# Patient Record
Sex: Male | Born: 1980 | Race: Black or African American | Hispanic: No | Marital: Single | Smoking: Never smoker
Health system: Southern US, Community
[De-identification: ages and names within clinical notes are randomized; demographics above are authoritative.]

## PROBLEM LIST (undated history)

## (undated) DIAGNOSIS — I10 Essential (primary) hypertension: Secondary | ICD-10-CM

## (undated) DIAGNOSIS — M109 Gout, unspecified: Secondary | ICD-10-CM

## (undated) HISTORY — PX: HERNIA REPAIR: SHX51

---
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
## 2001-04-10 ENCOUNTER — Encounter: Payer: Self-pay | Admitting: Emergency Medicine

## 2001-04-10 ENCOUNTER — Emergency Department (HOSPITAL_COMMUNITY): Admission: EM | Admit: 2001-04-10 | Discharge: 2001-04-10 | Payer: Self-pay | Admitting: Emergency Medicine

## 2001-07-11 ENCOUNTER — Encounter: Payer: Self-pay | Admitting: Emergency Medicine

## 2001-07-11 ENCOUNTER — Inpatient Hospital Stay (HOSPITAL_COMMUNITY): Admission: EM | Admit: 2001-07-11 | Discharge: 2001-07-12 | Payer: Self-pay | Admitting: Emergency Medicine

## 2001-07-12 ENCOUNTER — Encounter (HOSPITAL_BASED_OUTPATIENT_CLINIC_OR_DEPARTMENT_OTHER): Payer: Self-pay | Admitting: Internal Medicine

## 2003-10-27 ENCOUNTER — Emergency Department (HOSPITAL_COMMUNITY): Admission: EM | Admit: 2003-10-27 | Discharge: 2003-10-27 | Payer: Self-pay | Admitting: Emergency Medicine

## 2005-12-01 ENCOUNTER — Emergency Department (HOSPITAL_COMMUNITY): Admission: EM | Admit: 2005-12-01 | Discharge: 2005-12-01 | Payer: Self-pay | Admitting: Emergency Medicine

## 2006-11-22 ENCOUNTER — Emergency Department (HOSPITAL_COMMUNITY): Admission: EM | Admit: 2006-11-22 | Discharge: 2006-11-22 | Payer: Self-pay | Admitting: Family Medicine

## 2007-02-15 ENCOUNTER — Emergency Department (HOSPITAL_COMMUNITY): Admission: EM | Admit: 2007-02-15 | Discharge: 2007-02-15 | Payer: Self-pay | Admitting: Emergency Medicine

## 2007-07-20 ENCOUNTER — Ambulatory Visit: Payer: Self-pay | Admitting: Family Medicine

## 2007-09-21 ENCOUNTER — Ambulatory Visit (HOSPITAL_BASED_OUTPATIENT_CLINIC_OR_DEPARTMENT_OTHER): Admission: RE | Admit: 2007-09-21 | Discharge: 2007-09-21 | Payer: Self-pay | Admitting: General Surgery

## 2007-10-19 ENCOUNTER — Ambulatory Visit: Payer: Self-pay | Admitting: Family Medicine

## 2009-06-08 ENCOUNTER — Emergency Department (HOSPITAL_COMMUNITY): Admission: EM | Admit: 2009-06-08 | Discharge: 2009-06-08 | Payer: Self-pay | Admitting: Emergency Medicine

## 2009-11-27 ENCOUNTER — Emergency Department (HOSPITAL_COMMUNITY): Admission: EM | Admit: 2009-11-27 | Discharge: 2009-11-27 | Payer: Self-pay | Admitting: Emergency Medicine

## 2010-05-16 ENCOUNTER — Emergency Department (HOSPITAL_COMMUNITY): Admission: EM | Admit: 2010-05-16 | Discharge: 2010-05-16 | Payer: Self-pay | Admitting: Internal Medicine

## 2010-05-22 ENCOUNTER — Emergency Department (HOSPITAL_COMMUNITY): Admission: EM | Admit: 2010-05-22 | Discharge: 2010-05-22 | Payer: Self-pay | Admitting: Family Medicine

## 2010-09-10 ENCOUNTER — Emergency Department (HOSPITAL_COMMUNITY): Admission: EM | Admit: 2010-09-10 | Discharge: 2010-09-10 | Payer: Self-pay | Admitting: Emergency Medicine

## 2010-12-30 HISTORY — PX: OTHER SURGICAL HISTORY: SHX169

## 2011-04-08 LAB — GC/CHLAMYDIA PROBE AMP, GENITAL
Chlamydia, DNA Probe: NEGATIVE
GC Probe Amp, Genital: NEGATIVE

## 2011-05-14 NOTE — Op Note (Signed)
Jaime Barton, Jaime Barton NO.:  [id number]   MEDICAL RECORD NO.:  [id number]          PATIENT TYPE:  AMB   LOCATION:  DSC                          FACILITY:  [facility]   PHYSICIAN:  Gabrielle Dare. Janee Morn, M.D.DATE OF BIRTH:  12/24/1981   DATE OF PROCEDURE:  09/21/2007  DATE OF DISCHARGE:                               OPERATIVE REPORT   PREOPERATIVE DIAGNOSIS:  Right inguinal hernia.   POSTOPERATIVE DIAGNOSIS:  Right inguinal hernia.   PROCEDURE:  Repair of right inguinal hernia with mesh.   SURGEON:  Gabrielle Dare. Janee Morn, M.D.   ANESTHESIA:  General.   HISTORY OF PRESENT ILLNESS:  Jaime Barton is a 30 year old African  American gentleman who I evaluated in the office for a symptomatic right  inguinal hernia.  He presents today for elective repair.   PROCEDURE IN DETAIL:  Informed consent was obtained.  The patient  received intravenous antibiotics.  His site was marked after identifying  him in the preop holding area.  He was brought to the operating room.  General anesthesia was administered with laryngeal mask airway by the  anesthesia staff.  His abdomen and both groins were prepped and draped  in sterile fashion.  Marcaine 0.25% with epinephrine was injected along  the planned inguinal incision site on the right and out towards the  anterior-superior iliac spine for postoperative pain relief.   A right inguinal incision was made.  The subcutaneous tissues were  dissected down through Scarpa's fascia, revealing the external oblique  fascia.  Hemostasis was assured with Bovie cautery.  The external  oblique fascia was divided laterally.  The division was continued  sharply down through the external ring.  The superior leaflet was  dissected free off the underlying transversalis.  The inferior leaflet  was dissected down, revealing the shelving edge of the inguinal  ligament.  The inguinal branch of the ilioinguinal nerve was identified  and was a good size.  It was  carefully dissected and kept out of the way  along with the inferior leaflet of the external oblique fascia.  The  cord structures were encircled with a Penrose drain.  The floor was  inspected and was intact.  The cord was then dissected.  There was a  large indirect hernia sac that was quite stuck to the cord structures.  Great care was taken with a slow dissection.  The sac was freed up from  the cord structures.  The sac was opened.  All contents were reduced.  It seemed to only contain a small piece of omentum.  The sac was then  high ligated with a 2-0 Vicryl suture and excised, cauterizing the  edges.  The hernia repair was then completed with a keyhole  polypropylene mesh.  This was fashioned to shape and then secured with 0  Prolene, first to the tissues over the pubic tubercle medially and then  extending along inferiorly with running 0 Prolene suture securing the  mesh to the shelving edge of inguinal ligament.  Superiorly, the mesh  was again tacked to the tissues over the pubic tubercle medially  and  with 0 Prolene suture, and then it was tacked down with several  interrupted 0 Prolene sutures extending along laterally, tacking the  mesh down to the transversalis.  The two leaflets of the mesh were  rejoined behind the cord structures and tacked down together into the  underlying musculature, and they did extend out laterally underneath the  flap of the external oblique fascia.  The aperture in the mesh were then  adjusted slightly larger to admit the tip of the fifth digit.  The area  was copiously irrigated.  Meticulous hemostasis was assured.  Some  additional local anesthetic was injected.  The nerve was then carefully  repositioned beneath the external oblique fascia.  The external oblique  was closed with a running 2-0 Vicryl suture, taking care not to involve  the nerve or underlying cord structures.  The wound was then irrigated  again, and hemostasis was assured.   Scarpa's fascia was approximated  with interrupted 3-0 Vicryl sutures, and the skin was closed with a  running 4-0 Monocryl subcuticular stitch.  Sponge, needle and instrument  counts were correct.  Benzoin and Steri-Strips and sterile dressings  were applied.  The patient's right testicle was relocated to anatomic  position in the scrotum at the end of the case.   The patient tolerated the procedure well without apparent complication  and was taken to the recovery room in stable condition.      Gabrielle Dare Janee Morn, M.D.  Electronically Signed     BET/MEDQ  D:  09/21/2007  T:  09/21/2007  Job:  [id number]   cc:   Sharlot Gowda, M.D.
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
## 2011-06-29 ENCOUNTER — Emergency Department (HOSPITAL_COMMUNITY): Payer: Self-pay

## 2011-06-29 ENCOUNTER — Emergency Department (HOSPITAL_COMMUNITY)
Admission: EM | Admit: 2011-06-29 | Discharge: 2011-06-29 | Disposition: A | Discharge status: Home / Self Care | Payer: Self-pay | Attending: Emergency Medicine | Admitting: Emergency Medicine

## 2011-06-29 DIAGNOSIS — S52309A Unspecified fracture of shaft of unspecified radius, initial encounter for closed fracture: Secondary | ICD-10-CM | POA: Insufficient documentation

## 2011-06-29 DIAGNOSIS — W010XXA Fall on same level from slipping, tripping and stumbling without subsequent striking against object, initial encounter: Secondary | ICD-10-CM | POA: Insufficient documentation

## 2011-06-29 DIAGNOSIS — Y9302 Activity, running: Secondary | ICD-10-CM | POA: Insufficient documentation

## 2011-06-29 DIAGNOSIS — M79609 Pain in unspecified limb: Secondary | ICD-10-CM | POA: Insufficient documentation

## 2011-06-29 DIAGNOSIS — S6990XA Unspecified injury of unspecified wrist, hand and finger(s), initial encounter: Secondary | ICD-10-CM | POA: Insufficient documentation

## 2011-06-29 DIAGNOSIS — R000 Tachycardia, unspecified: Secondary | ICD-10-CM | POA: Insufficient documentation

## 2011-06-29 DIAGNOSIS — S59909A Unspecified injury of unspecified elbow, initial encounter: Secondary | ICD-10-CM | POA: Insufficient documentation

## 2011-07-08 ENCOUNTER — Encounter (HOSPITAL_COMMUNITY)
Admission: RE | Admit: 2011-07-08 | Discharge: 2011-07-08 | Disposition: A | Discharge status: Home / Self Care | Payer: Self-pay | Source: Ambulatory Visit | Attending: Orthopedic Surgery | Admitting: Orthopedic Surgery

## 2011-07-08 DIAGNOSIS — Z01812 Encounter for preprocedural laboratory examination: Secondary | ICD-10-CM | POA: Insufficient documentation

## 2011-07-08 DIAGNOSIS — Z01818 Encounter for other preprocedural examination: Secondary | ICD-10-CM | POA: Insufficient documentation

## 2011-07-08 LAB — CBC
HCT: 45 % (ref 39.0–52.0)
Hemoglobin: 16 g/dL (ref 13.0–17.0)
MCH: 30.1 pg (ref 26.0–34.0)
MCHC: 35.6 g/dL (ref 30.0–36.0)
RBC: 5.31 MIL/uL (ref 4.22–5.81)

## 2011-07-08 LAB — SURGICAL PCR SCREEN: Staphylococcus aureus: NEGATIVE

## 2011-07-10 ENCOUNTER — Ambulatory Visit (HOSPITAL_COMMUNITY)
Admission: RE | Admit: 2011-07-10 | Discharge: 2011-07-11 | Disposition: A | Discharge status: Home / Self Care | Payer: Self-pay | Source: Ambulatory Visit | Attending: Orthopedic Surgery | Admitting: Orthopedic Surgery

## 2011-07-10 DIAGNOSIS — S52309A Unspecified fracture of shaft of unspecified radius, initial encounter for closed fracture: Secondary | ICD-10-CM | POA: Insufficient documentation

## 2011-07-10 DIAGNOSIS — W19XXXA Unspecified fall, initial encounter: Secondary | ICD-10-CM | POA: Insufficient documentation

## 2011-07-22 NOTE — Op Note (Signed)
NAMENEHAN, FLAUM NO.:  [id number]  MEDICAL RECORD NO.:  [id number]  LOCATION:  5024                         FACILITY:  [facility]  PHYSICIAN:  Madelynn Done, MD  DATE OF BIRTH:  08-27-1981  DATE OF PROCEDURE:  07/10/2011 DATE OF DISCHARGE:                              OPERATIVE REPORT   PREOPERATIVE DIAGNOSIS:  Right radial shaft fracture, Galeazzi fracture variant.  POSTOPERATIVE DIAGNOSIS:  Right radial shaft fracture, Galeazzi fracture variant.  ATTENDING PHYSICIAN:  Sharma Covert IV, MD, who scrubbed and present for the entire procedure.  ASSISTANT SURGEON:  None.  SURGICAL PROCEDURES:  Open treatment of right radial shaft fracture, Galeazzi fracture variant with internal fixation of the radial shaft without fixation of distal radioulnar joint.  RADIOGRAPHS:  Three views right forearm.  SURGICAL IMPLANTS:  DePuy, small fragment, DCP plate with 6 cortices proximally, 6 cortices distally, compression plating technique.  SURGICAL INDICATIONS:  Mr. Stary is a 30 year old right hand dominant gentleman who sustained a closed injury to his right radial shaft.  Thus recommended the patient undergo the above procedure.  Risks, benefits, and alternatives were discussed in detail with the patient and signed informed consent was obtained.  Risks include but not limited to bleeding, infection, damage to nearby nerves, arteries, or tendons, nonunion, malunion, malrotation, hardware failure, loss of motion in the wrist and digits and need for further surgical intervention.  DESCRIPTION OF PROCEDURE:  The patient was brought and identified in the preop holding area, and a mark with permanent marker made on the right radial shaft preformed to indicate correct operative site.  The patient was brought back to the operating room, placed supine on anesthesia room table.  General anesthesia was administered.  The patient tolerated this well.  A well-padded  tourniquet was then placed in the right forearm and sealed with 1000 drape.  The right upper extremity was prepped and draped in a normal sterile fashion.  Time-out was called.  The correct side was identified and procedure was then begun.  The patient then turned to the right forearm for a longitudinal incision made directly over the FCR sheath.  Dissection was then carried down through the skin and subcutaneous tissue going between the FCR and the radial artery. Dissection was then carried down to the fracture site.  The FPL was swept off, the radial shaft elevated.  The fracture site was then exposed.  An open reduction was then performed of the radial shaft. Following this, a 7-hole plate was then clamped to the bone, reduction was then maintained.  Using oblong hole distally, this was loaded in a neutral position.  Compression plating was then carried out using compression technique proximally.  __________ with good compression across the fracture site.  After both sides were then fixed, the remaining screw holes were then filled to complete 6 cortices proximally and distally with a 3.5nonlocking screws.  The appropriate 3.5 mm drill bit and depth gauge measurement.  Copious wound irrigation was then done throughout.  Final radiographs were obtained, 3-views of the forearm. The distal radioulnar joint was then assessed.  No instability after fixation of the radial shaft.  Wounds were then  irrigated.  The subcutaneous tissue was then closed with 4-0 Vicryl.  Tourniquet deflated less than 75 minutes.  The skin was then closed using a running 4-0 Prolene suture.  Adaptic dressing and sterile compressive bandage were then applied.  The patient was then placed in well-padded sugar- tong splint, extubated, and taken to recovery room in good condition.  Intraoperative radiographs, 3-views of the forearm did show the internal fixation placed in good position in both planes.  POSTOPERATIVE  PLAN:  The patient admitted for overnight for IV antibiotics and pain control, discharged in the morning, seen back in the office in approximately 2 weeks for wound check, suture removal, x- rays, and then likely transition into a forearm fracture brace and gradually use and activity.     Madelynn Done, MD     FWO/MEDQ  D:  07/10/2011  T:  07/11/2011  Job:  [id number]  Electronically Signed by Bradly Bienenstock IV MD on 07/22/2011 09:57:42 PM

## 2011-10-10 LAB — POCT HEMOGLOBIN-HEMACUE: Hemoglobin: 17

## 2012-11-14 ENCOUNTER — Emergency Department (INDEPENDENT_AMBULATORY_CARE_PROVIDER_SITE_OTHER)
Admission: EM | Admit: 2012-11-14 | Discharge: 2012-11-14 | Disposition: A | Discharge status: Home / Self Care | Payer: Self-pay | Source: Home / Self Care

## 2012-11-14 ENCOUNTER — Encounter (HOSPITAL_COMMUNITY): Payer: Self-pay | Admitting: Emergency Medicine

## 2012-11-14 DIAGNOSIS — M549 Dorsalgia, unspecified: Secondary | ICD-10-CM

## 2012-11-14 DIAGNOSIS — M791 Myalgia, unspecified site: Secondary | ICD-10-CM

## 2012-11-14 DIAGNOSIS — IMO0001 Reserved for inherently not codable concepts without codable children: Secondary | ICD-10-CM

## 2012-11-14 DIAGNOSIS — M542 Cervicalgia: Secondary | ICD-10-CM

## 2012-11-14 MED ORDER — NAPROXEN 500 MG PO TBEC: 500.0000 mg | DELAYED_RELEASE_TABLET | Freq: Two times a day (BID) | ORAL | Status: DC
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
## 2012-11-14 NOTE — ED Notes (Signed)
Requested patient place on gown for evaluation by physician.  Patient seemed reluctant, explained purpose.

## 2012-11-14 NOTE — ED Provider Notes (Signed)
Medical screening examination/treatment/procedure(s) were performed by non-physician practitioner and as supervising physician I was immediately available for consultation/collaboration.  Leslee Home, M.D.   Reuben Likes, MD 11/14/12 [phone]

## 2013-03-30 ENCOUNTER — Encounter (HOSPITAL_COMMUNITY): Payer: Self-pay

## 2013-03-30 ENCOUNTER — Emergency Department (INDEPENDENT_AMBULATORY_CARE_PROVIDER_SITE_OTHER)
Admission: EM | Admit: 2013-03-30 | Discharge: 2013-03-30 | Disposition: A | Discharge status: Home / Self Care | Payer: Self-pay | Source: Home / Self Care | Attending: Family Medicine | Admitting: Family Medicine

## 2013-03-30 DIAGNOSIS — M549 Dorsalgia, unspecified: Secondary | ICD-10-CM

## 2013-03-30 DIAGNOSIS — M542 Cervicalgia: Secondary | ICD-10-CM

## 2013-03-30 MED ORDER — CYCLOBENZAPRINE HCL 10 MG PO TABS: 10.0000 mg | ORAL_TABLET | Freq: Two times a day (BID) | ORAL | Status: DC | PRN

## 2013-03-30 MED ORDER — IBUPROFEN 800 MG PO TABS: 800.0000 mg | ORAL_TABLET | Freq: Three times a day (TID) | ORAL | Status: DC
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
## 2013-04-01 NOTE — ED Provider Notes (Signed)
Medical screening examination/treatment/procedure(s) were performed by resident physician or non-physician practitioner and as supervising physician I was immediately available for consultation/collaboration.   Kamaury Cutbirth DOUGLAS MD.   Ashaunti Treptow D Tomie Spizzirri, MD 04/01/13 1942 

## 2014-01-11 ENCOUNTER — Encounter (HOSPITAL_COMMUNITY): Payer: Self-pay | Admitting: Emergency Medicine

## 2014-01-11 ENCOUNTER — Emergency Department (INDEPENDENT_AMBULATORY_CARE_PROVIDER_SITE_OTHER)
Admission: EM | Admit: 2014-01-11 | Discharge: 2014-01-11 | Disposition: A | Discharge status: Home / Self Care | Payer: Self-pay | Source: Home / Self Care

## 2014-01-11 DIAGNOSIS — S161XXA Strain of muscle, fascia and tendon at neck level, initial encounter: Secondary | ICD-10-CM

## 2014-01-11 DIAGNOSIS — Z043 Encounter for examination and observation following other accident: Secondary | ICD-10-CM

## 2014-01-11 DIAGNOSIS — S139XXA Sprain of joints and ligaments of unspecified parts of neck, initial encounter: Secondary | ICD-10-CM

## 2014-01-11 DIAGNOSIS — S335XXA Sprain of ligaments of lumbar spine, initial encounter: Secondary | ICD-10-CM

## 2014-01-11 DIAGNOSIS — M549 Dorsalgia, unspecified: Secondary | ICD-10-CM

## 2014-01-11 DIAGNOSIS — S39012A Strain of muscle, fascia and tendon of lower back, initial encounter: Secondary | ICD-10-CM

## 2014-01-11 DIAGNOSIS — Z041 Encounter for examination and observation following transport accident: Secondary | ICD-10-CM

## 2014-01-11 MED ORDER — TRAMADOL HCL 50 MG PO TABS: 50.0000 mg | ORAL_TABLET | Freq: Four times a day (QID) | ORAL | Status: DC | PRN

## 2014-01-11 MED ORDER — DICLOFENAC POTASSIUM 50 MG PO TABS: 50.0000 mg | ORAL_TABLET | Freq: Three times a day (TID) | ORAL | Status: DC
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
## 2014-01-11 NOTE — Discharge Instructions (Signed)
Back Pain, Adult Low back pain is very common. About 1 in 5 people have back pain.The cause of low back pain is rarely dangerous. The pain often gets better over time.About half of people with a sudden onset of back pain feel better in just 2 weeks. About 8 in 10 people feel better by 6 weeks.  CAUSES Some common causes of back pain include:  Strain of the muscles or ligaments supporting the spine.  Wear and tear (degeneration) of the spinal discs.  Arthritis.  Direct injury to the back. DIAGNOSIS Most of the time, the direct cause of low back pain is not known.However, back pain can be treated effectively even when the exact cause of the pain is unknown.Answering your caregiver's questions about your overall health and symptoms is one of the most accurate ways to make sure the cause of your pain is not dangerous. If your caregiver needs more information, he or she may order lab work or imaging tests (X-rays or MRIs).However, even if imaging tests show changes in your back, this usually does not require surgery. HOME CARE INSTRUCTIONS For many people, back pain returns.Since low back pain is rarely dangerous, it is often a condition that people can learn to [facility] their own.   Remain active. It is stressful on the back to sit or stand in one place. Do not sit, drive, or stand in one place for more than 30 minutes at a time. Take short walks on level surfaces as soon as pain allows.Try to increase the length of time you walk each day.  Do not stay in bed.Resting more than 1 or 2 days can delay your recovery.  Do not avoid exercise or work.Your body is made to move.It is not dangerous to be active, even though your back may hurt.Your back will likely heal faster if you return to being active before your pain is gone.  Pay attention to your body when you bend and lift. Many people have less discomfortwhen lifting if they bend their knees, keep the load close to their bodies,and  avoid twisting. Often, the most comfortable positions are those that put less stress on your recovering back.  Find a comfortable position to sleep. Use a firm mattress and lie on your side with your knees slightly bent. If you lie on your back, put a pillow under your knees.  Only take over-the-counter or prescription medicines as directed by your caregiver. Over-the-counter medicines to reduce pain and inflammation are often the most helpful.Your caregiver may prescribe muscle relaxant drugs.These medicines help dull your pain so you can more quickly return to your normal activities and healthy exercise.  Put ice on the injured area.  Put ice in a plastic bag.  Place a towel between your skin and the bag.  Leave the ice on for 15-20 minutes, 03-04 times a day for the first 2 to 3 days. After that, ice and heat may be alternated to reduce pain and spasms.  Ask your caregiver about trying back exercises and gentle massage. This may be of some benefit.  Avoid feeling anxious or stressed.Stress increases muscle tension and can worsen back pain.It is important to recognize when you are anxious or stressed and learn ways to manage it.Exercise is a great option. SEEK MEDICAL CARE IF:  You have pain that is not relieved with rest or medicine.  You have pain that does not improve in 1 week.  You have new symptoms.  You are generally not feeling well. SEEK  IMMEDIATE MEDICAL CARE IF:   You have pain that radiates from your back into your legs.  You develop new bowel or bladder control problems.  You have unusual weakness or numbness in your arms or legs.  You develop nausea or vomiting.  You develop abdominal pain.  You feel faint. Document Released: 12/16/2005 Document Revised: 06/16/2012 Document Reviewed: 05/06/2011 [facility] Patient Information 2014 [place], [place].  Cervical Sprain A cervical sprain is when the tissues (ligaments) that hold the neck bones in place stretch  or tear. HOME CARE   Put ice on the injured area.  Put ice in a plastic bag.  Place a towel between your skin and the bag.  Leave the ice on for 15 20 minutes, 3 4 times a day.  You may have been given a collar to wear. This collar keeps your neck from moving while you heal.  Do not take the collar off unless told by your doctor.  If you have long hair, keep it outside of the collar.  Ask your doctor before changing the position of your collar. You may need to change its position over time to make it more comfortable.  If you are allowed to take off the collar for cleaning or bathing, follow your doctor's instructions on how to do it safely.  Keep your collar clean by wiping it with mild soap and water. Dry it completely. If the collar has removable pads, remove them every 1 2 days to hand wash them with soap and water. Allow them to air dry. They should be dry before you wear them in the collar.  Do not drive while wearing the collar.  Only take medicine as told by your doctor.  Keep all doctor visits as told.  Keep all physical therapy visits as told.  Adjust your work station so that you have good posture while you work.  Avoid positions and activities that make your problems worse.  Warm up and stretch before being active. GET HELP IF:  Your pain is not controlled with medicine.  You cannot take less pain medicine over time as planned.  Your activity level does not improve as expected. GET HELP RIGHT AWAY IF:   You are bleeding.  Your stomach is upset.  You have an allergic reaction to your medicine.  You develop new problems that you cannot explain.  You lose feeling (become numb) or you cannot move any part of your body (paralysis).  You have tingling or weakness in any part of your body.  Your symptoms get worse. Symptoms include:  Pain, soreness, stiffness, puffiness (swelling), or a burning feeling in your neck.  Pain when your neck is  touched.  Shoulder or upper back pain.  Limited ability to move your neck.  Headache.  Dizziness.  Your hands or arms feel week, lose feeling, or tingle.  Muscle spasms.  Difficulty swallowing or chewing. MAKE SURE YOU:   Understand these instructions.  Will watch your condition.  Will get help right away if you are not doing well or get worse. Document Released: 06/03/2008 Document Revised: 08/18/2013 Document Reviewed: 06/23/2013 [facility] Patient Information 2014 [place], [place].  Mid-Back Strain with Rehab  A strain is an injury in which a tendon or muscle is torn. The muscles and tendons of the mid-back are vulnverable to strains. However, these muscles and tendons are very strong and require a great force to be injured. The muscles of the mid-back are responsible for stabilizing the spinal column, as well as  spinal twisting (rotation). Strains are classified into three categories. Grade 1 strains cause pain, but the tendon is not lengthened. Grade 2 strains include a lengthened ligament, due to the ligament being stretched or partially ruptured. With grade 2 strains there is still function, although the function may be decreased. Grade 3 strains involve a complete tear of the tendon or muscle, and function is usually impaired. SYMPTOMS   Pain in the middle of the back.  Pain that may affect only one side, and is worse with movement.  Muscle spasms, and often swelling in the back.  Loss of strength of the back muscles.  Crackling sound (crepitation) when the muscles are touched. CAUSES  Mid-back strains occur when a force is placed on the muscles or tendons that is greater than they can handle. Common causes of injury include:  Ongoing overuse of the muscle-tendon units in the middle back, usually from incorrect body posture.  A single violent injury or force applied to the back. RISK INCREASES WITH:  Sports that involve twisting forces on the spine or a lot of  bending at the waist (football, rugby, weightlifting, bowling, golf, tennis, speed skating, racquetball, swimming, running, gymnastics, diving).  Poor strength and flexibility.  Failure to warm up properly before activity.  Family history of low back pain or disk disorders.  Previous back injury or surgery (especially fusion). PREVENTION  Learn and use proper sports technique.  Warm up and stretch properly before activity.  Allow for adequate recovery between workouts.  Maintain physical fitness:  Strength, flexibility, and endurance.  Cardiovascular fitness. PROGNOSIS  If treated properly, mid-back strains usually heal within 6 weeks. RELATED COMPLICATIONS   Frequently recurring symptoms, resulting in a chronic problem. Properly treating the problem the first time decreases frequency of recurrence.  Chronic inflammation, scarring, and partial muscle-tendon tear.  Delayed healing or resolution of symptoms, especially if activity is resumed too soon.  Prolonged disability. TREATMENT Treatment first involves the use of ice and medicine, to reduce pain and inflammation. As the pain begins to subside, you may begin strengthening and stretching exercises to improve body posture and sport technique. These exercises may be performed at home or with a therapist. Severe injuries may require referral to a therapist for further evaluation and treatment, such as ultrasound. Corticosteroid injections may be given to help reduce inflammation. Biofeedback (watching monitors of your body processes) and psychotherapy may also be prescribed. Prolonged bed rest is felt to do more harm than good. Massage may help break the muscle spasms. Sometimes, an injection of cortisone, with or without local anesthetics, may be given to help relieve the pain and spasms. MEDICATION   If pain medicine is needed, nonsteroidal anti-inflammatory medicines (aspirin and ibuprofen), or other minor pain relievers  (acetaminophen), are often advised.  Do not take pain medicine for 7 days before surgery.  Prescription pain relievers may be given, if your caregiver thinks they are needed. Use only as directed and only as much as you need.  Ointments applied to the skin may be helpful.  Corticosteroid injections may be given by your caregiver. These injections should be reserved for the most serious cases, because they may only be given a certain number of times. HEAT AND COLD:   Cold treatment (icing) should be applied for 10 to 15 minutes every 2 to 3 hours for inflammation and pain, and immediately after activity that aggravates your symptoms. Use ice packs or an ice massage.  Heat treatment may be used before performing stretching  and strengthening activities prescribed by your caregiver, physical therapist, or athletic trainer. Use a heat pack or a warm water soak. SEEK IMMEDIATE MEDICAL CARE IF:  Symptoms get worse or do not improve in 2 to 4 weeks, despite treatment.  You develop numbness, weakness, or loss of bowel or bladder function.  New, unexplained symptoms develop. (Drugs used in treatment may produce side effects.) EXERCISES RANGE OF MOTION (ROM) AND STRETCHING EXERCISES - Mid-Back Strain These exercises may help you when beginning to rehabilitate your injury. In order to successfully resolve your symptoms, you must improve your posture. These exercises are designed to help reduce the forward-head and rounded-shoulder posture which contributes to this condition. Your symptoms may resolve with or without further involvement from your physician, physical therapist or athletic trainer. While completing these exercises, remember:   Restoring tissue flexibility helps normal motion to return to the joints. This allows healthier, less painful movement and activity.  An effective stretch should be held for at least 30 seconds.  A stretch should never be painful. You should only feel a gentle  lengthening or release in the stretched tissue. STRETECH - Axial Extension  Stand or sit on a firm surface. Assume a good posture: chest up, shoulders drawn back, stomach muscles slightly tense, knees unlocked (if standing) and feet hip width apart.  Slowly retract your chin, so your head slides back and your chin slightly lowers. Continue to look straight ahead.  You should feel a gentle stretch in the back of your head. Be certain not to feel an aggressive stretch since this can cause headaches later.  Hold for __________ seconds. Repeat __________ times. Complete this exercise __________ times per day. RANGE OF MOTION- Upper Thoracic Extension  Sit on a firm chair with a high back. Assume a good posture: chest up, shoulders drawn back, abdominal muscles slightly tense, and feet hip width apart. Place a small pillow or folded towel in the curve of your lower back, if you are having difficulty maintaining good posture.  Gently brace your neck with your hands, allowing your arms to rest on your chest.  Continue to support your neck and slowly extend your back over the chair. You will feel a stretch across your upper back.  Hold __________ seconds. Slowly return to the starting position. Repeat __________ times. Complete this exercise __________ times per day. RANGE OF MOTION- Mid-Thoracic Extension  Roll a towel so that it is about 4 inches in diameter.  Position the towel lengthwise. Lay on the towel so that your spine, but not your shoulder blades, are supported.  You should feel your mid-back arching toward the floor. To increase the stretch, extend your arms away from your body.  Hold for __________ seconds. Repeat exercise __________ times, __________ times per day. STRENGTHENING EXERCISES - Mid-Back Strain These exercises may help you when beginning to rehabilitate your injury. They may resolve your symptoms with or without further involvement from your physician, physical  therapist or athletic trainer. While completing these exercises, remember:   Muscles can gain both the endurance and the strength needed for everyday activities through controlled exercises.  Complete these exercises as instructed by your physician, physical therapist or athletic trainer. Increase the resistance and repetitions only as guided by your caregiver.  You may experience muscle soreness or fatigue, but the pain or discomfort you are trying to eliminate should never worsen during these exercises. If this pain does worsen, stop and make certain you are following the directions exactly. If the  pain is still present after adjustments, discontinue the exercise until you can discuss the trouble with your caregiver. STRENGTHENING Quadruped, Opposite UE/LE Lift  Assume a hands and knees position on a firm surface. Keep your hands under your shoulders and your knees under your hips. You may place padding under your knees for comfort.  Find your neutral spine and gently tense your abdominal muscles so that you can maintain this position. Your shoulders and hips should form a rectangle that is parallel with the floor and is not twisted.  Keeping your trunk steady, lift your right hand no higher than your shoulder and then your left leg no higher than your hip. Make sure you are not holding your breath. Hold this position __________ seconds.  Continuing to keep your abdominal muscles tense and your back steady, slowly return to your starting position. Repeat with the opposite arm and leg. Repeat __________ times. Complete this exercise __________ times per day.  STRENGTH - Shoulder Extensors  Secure a rubber exercise band or tubing to a fixed object (table, pole) so that it is at the height of your shoulders when you are either standing, or sitting on a firm armless chair.  With a thumbs-up grip, grasp an end of the band in each hand. Straighten your elbows and lift your hands straight in front of  you at shoulder height. Step back away from the secured end of band, until it becomes tense.  Squeezing your shoulder blades together, pull your hands down to the sides of your thighs. Do not allow your hands to go behind you.  Hold for __________ seconds. Slowly ease the tension on the band, as you reverse the directions and return to the starting position. Repeat __________ times. Complete this exercise __________ times per day.  STRENGTH - Horizontal Abductors Choose one of the two positions to complete this exercise. Prone: lying on stomach:  Lie on your stomach on a firm surface so that your right / left arm overhangs the edge. Rest your forehead on your opposite forearm. With your palm facing the floor and your elbow straight, hold a __________ weight in your hand.  Squeeze your right / left shoulder blade to your mid-back spine and then slowly raise your arm to the height of the bed.  Hold for __________ seconds. Slowly reverse the directions and return to the starting position, controlling the weight as you lower your arm. Repeat __________ times. Complete this exercise __________ times per day. Standing:   Secure a rubber exercise band or tubing, so that it is at the height of your shoulders when you are either standing, or sitting on a firm armless chair.  Grasp an end of the band in each hand and have your palms face each other. Straighten your elbows and lift your hands straight in front of you at shoulder height. Step back away from the secured end of band, until it becomes tense.  Squeeze your shoulder blades together. Keeping your elbows locked and your hands at shoulder height, spread your arms apart, forming a "T" shape with your body. Hold __________ seconds. Slowly ease the tension on the band, as you reverse the directions and return to the starting position. Repeat __________ times. Complete this exercise __________ times per day. STRENGTH - Scapular Retractors and  External Rotators, Rowing  Secure a rubber exercise band or tubing, so that it is at the height of your shoulders when you are either standing, or sitting on a firm armless chair.  With  a palm-down grip, grasp an end of the band in each hand. Straighten your elbows and lift your hands straight in front of you at shoulder height. Step back away from the secured end of band, until it becomes tense.  Step 1: Squeeze your shoulder blades together. Bending your elbows, draw your hands to your chest as if you are rowing a boat. At the end of this motion, your hands and elbow should be at shoulder height and your elbows should be out to your sides.  Step 2: Rotate your shoulder to raise your hands above your head. Your forearms should be vertical and your upper arms should be horizontal.  Hold for __________ seconds. Slowly ease the tension on the band, as you reverse the directions and return to the starting position. Repeat __________ times. Complete this exercise __________ times per day.  POSTURE AND BODY MECHANICS CONSIDERATIONS  Mid-Back Strain Keeping correct posture when sitting, standing or completing your activities will reduce the stress put on different body tissues, allowing injured tissues a chance to heal and limiting painful experiences. The following are general guidelines for improved posture. Your physician or physical therapist will provide you with any instructions specific to your needs. While reading these guidelines, remember:  The exercises prescribed by your provider will help you have the flexibility and strength to maintain correct postures.  The correct posture provides the best environment for your joints to work. All of your joints have less wear and tear when properly supported by a spine with good posture. This means you will experience a healthier, less painful body.  Correct posture must be practiced with all of your activities, especially prolonged sitting and  standing. Correct posture is as important when doing repetitive low-stress activities (typing) as it is when doing a single heavy-load activity (lifting). PROPER SITTING POSTURE In order to minimize stress and discomfort on your spine, you must sit with correct posture. Sitting with good posture should be effortless for a healthy body. Returning to good posture is a gradual process. Many people can work toward this most comfortably by using various supports until they have the flexibility and strength to maintain this posture on their own. When sitting with proper posture, your ears will fall over your shoulders and your shoulders will fall over your hips. You should use the back of the chair to support your upper back. Your lower back will be in a neutral position, just slightly arched. You may place a small pillow or folded towel at the base of your low back for  support.  When working at a desk, create an environment that supports good, upright posture. Without extra support, muscles fatigue and lead to excessive strain on joints and other tissues. Keep these recommendations in mind: CHAIR:  A chair should be able to slide under your desk when your back makes contact with the back of the chair. This allows you to work closely.  The chair's height should allow your eyes to be level with the upper part of your monitor and your hands to be slightly lower than your elbows. BODY POSITION  Your feet should make contact with the floor. If this is not possible, use a foot rest.  Keep your ears over your shoulders. This will reduce stress on your neck and lower back. INCORRECT SITTING POSTURES If you are feeling tired and unable to assume a healthy sitting posture, do not slouch or slump. This puts excessive strain on your back tissues, causing more damage and  pain. Healthier options include:  Using more support, like a lumbar pillow.  Switching tasks to something that requires you to be upright or  walking.  Talking a brief walk.  Lying down to rest in a neutral-spine position. CORRECT STANDING POSTURES Proper standing posture should be assumed with all daily activities, even if they only take a few moments, like when brushing your teeth. As in sitting, your ears should fall over your shoulders and your shoulders should fall over your hips. You should keep a slight tension in your abdominal muscles to brace your spine. Your tailbone should point down to the ground, not behind your body, resulting in an over-extended swayback posture.  INCORRECT STANDING POSTURES Common incorrect standing postures include a forward head, locked knees, and an excessive swayback. WALKING Walk with an upright posture. Your ears, shoulders and hips should all line-up. CORRECT LIFTING TECHNIQUES DO :   Assume a wide stance. This will provide you more stability and the opportunity to get as close as possible to the object which you are lifting.  Tense your abdominals to brace your spine. Bend at the knees and hips. Keeping your back locked in a neutral-spine position, lift using your leg muscles. Lift with your legs, keeping your back straight.  Test the weight of unknown objects before attempting to lift them.  Try to keep your elbows locked down at your sides in order get the best strength from your shoulders when carrying an object.  Always ask for help when lifting heavy or awkward objects. INCORRECT LIFTING TECHNIQUES DO NOT:   Lock your knees when lifting, even if it is a small object.  Bend and twist. Pivot at your feet or move your feet when needing to change directions.  Assume that you can safely pick up even a paperclip without proper posture. Document Released: 12/16/2005 Document Revised: 03/09/2012 Document Reviewed: 03/30/2009 [facility] Patient Information 2014 [place], [place].  Low Back Strain with Rehab A strain is an injury in which a tendon or muscle is torn. The muscles and  tendons of the lower back are vulnerable to strains. However, these muscles and tendons are very strong and require a great force to be injured. Strains are classified into three categories. Grade 1 strains cause pain, but the tendon is not lengthened. Grade 2 strains include a lengthened ligament, due to the ligament being stretched or partially ruptured. With grade 2 strains there is still function, although the function may be decreased. Grade 3 strains involve a complete tear of the tendon or muscle, and function is usually impaired. SYMPTOMS   Pain in the lower back.  Pain that affects one side more than the other.  Pain that gets worse with movement and may be felt in the hip, buttocks, or back of the thigh.  Muscle spasms of the muscles in the back.  Swelling along the muscles of the back.  Loss of strength of the back muscles.  Crackling sound (crepitation) when the muscles are touched. CAUSES  Lower back strains occur when a force is placed on the muscles or tendons that is greater than they can handle. Common causes of injury include:  Prolonged overuse of the muscle-tendon units in the lower back, usually from incorrect posture.  A single violent injury or force applied to the back. RISK INCREASES WITH:  Sports that involve twisting forces on the spine or a lot of bending at the waist (football, rugby, weightlifting, bowling, golf, tennis, speed skating, racquetball, swimming, running, gymnastics, diving).  Poor strength and flexibility.  Failure to warm up properly before activity.  Family history of lower back pain or disk disorders.  Previous back injury or surgery (especially fusion).  Poor posture with lifting, especially heavy objects.  Prolonged sitting, especially with poor posture. PREVENTION   Learn and use proper posture when sitting or lifting (maintain proper posture when sitting, lift using the knees and legs, not at the waist).  Warm up and stretch  properly before activity.  Allow for adequate recovery between workouts.  Maintain physical fitness:  Strength, flexibility, and endurance.  Cardiovascular fitness. PROGNOSIS  If treated properly, lower back strains usually heal within 6 weeks. RELATED COMPLICATIONS   Recurring symptoms, resulting in a chronic problem.  Chronic inflammation, scarring, and partial muscle-tendon tear.  Delayed healing or resolution of symptoms.  Prolonged disability. TREATMENT  Treatment first involves the use of ice and medicine, to reduce pain and inflammation. The use of strengthening and stretching exercises may help reduce pain with activity. These exercises may be performed at home or with a therapist. Severe injuries may require referral to a therapist for further evaluation and treatment, such as ultrasound. Your caregiver may advise that you wear a back brace or corset, to help reduce pain and discomfort. Often, prolonged bed rest results in greater harm then benefit. Corticosteroid injections may be recommended. However, these should be reserved for the most serious cases. It is important to avoid using your back when lifting objects. At night, sleep on your back on a firm mattress with a pillow placed under your knees. If non-surgical treatment is unsuccessful, surgery may be needed.  MEDICATION   If pain medicine is needed, nonsteroidal anti-inflammatory medicines (aspirin and ibuprofen), or other minor pain relievers (acetaminophen), are often advised.  Do not take pain medicine for 7 days before surgery.  Prescription pain relievers may be given, if your caregiver thinks they are needed. Use only as directed and only as much as you need.  Ointments applied to the skin may be helpful.  Corticosteroid injections may be given by your caregiver. These injections should be reserved for the most serious cases, because they may only be given a certain number of times. HEAT AND COLD  Cold  treatment (icing) should be applied for 10 to 15 minutes every 2 to 3 hours for inflammation and pain, and immediately after activity that aggravates your symptoms. Use ice packs or an ice massage.  Heat treatment may be used before performing stretching and strengthening activities prescribed by your caregiver, physical therapist, or athletic trainer. Use a heat pack or a warm water soak. SEEK MEDICAL CARE IF:   Symptoms get worse or do not improve in 2 to 4 weeks, despite treatment.  You develop numbness, weakness, or loss of bowel or bladder function.  New, unexplained symptoms develop. (Drugs used in treatment may produce side effects.) EXERCISES  RANGE OF MOTION (ROM) AND STRETCHING EXERCISES - Low Back Strain Most people with lower back pain will find that their symptoms get worse with excessive bending forward (flexion) or arching at the lower back (extension). The exercises which will help resolve your symptoms will focus on the opposite motion.  Your physician, physical therapist or athletic trainer will help you determine which exercises will be most helpful to resolve your lower back pain. Do not complete any exercises without first consulting with your caregiver. Discontinue any exercises which make your symptoms worse until you speak to your caregiver.  If you have pain, numbness or  tingling which travels down into your buttocks, leg or foot, the goal of the therapy is for these symptoms to move closer to your back and eventually resolve. Sometimes, these leg symptoms will get better, but your lower back pain may worsen. This is typically an indication of progress in your rehabilitation. Be very alert to any changes in your symptoms and the activities in which you participated in the 24 hours prior to the change. Sharing this information with your caregiver will allow him/her to most efficiently treat your condition.  These exercises may help you when beginning to rehabilitate your  injury. Your symptoms may resolve with or without further involvement from your physician, physical therapist or athletic trainer. While completing these exercises, remember:  Restoring tissue flexibility helps normal motion to return to the joints. This allows healthier, less painful movement and activity.  An effective stretch should be held for at least 30 seconds.  A stretch should never be painful. You should only feel a gentle lengthening or release in the stretched tissue. FLEXION RANGE OF MOTION AND STRETCHING EXERCISES: STRETCH  Flexion, Single Knee to Chest   Lie on a firm bed or floor with both legs extended in front of you.  Keeping one leg in contact with the floor, bring your opposite knee to your chest. Hold your leg in place by either grabbing behind your thigh or at your knee.  Pull until you feel a gentle stretch in your lower back. Hold __________ seconds.  Slowly release your grasp and repeat the exercise with the opposite side. Repeat __________ times. Complete this exercise __________ times per day.  STRETCH  Flexion, Double Knee to Chest   Lie on a firm bed or floor with both legs extended in front of you.  Keeping one leg in contact with the floor, bring your opposite knee to your chest.  Tense your stomach muscles to support your back and then lift your other knee to your chest. Hold your legs in place by either grabbing behind your thighs or at your knees.  Pull both knees toward your chest until you feel a gentle stretch in your lower back. Hold __________ seconds.  Tense your stomach muscles and slowly return one leg at a time to the floor. Repeat __________ times. Complete this exercise __________ times per day.  STRETCH  Low Trunk Rotation  Lie on a firm bed or floor. Keeping your legs in front of you, bend your knees so they are both pointed toward the ceiling and your feet are flat on the floor.  Extend your arms out to the side. This will stabilize  your upper body by keeping your shoulders in contact with the floor.  Gently and slowly drop both knees together to one side until you feel a gentle stretch in your lower back. Hold for __________ seconds.  Tense your stomach muscles to support your lower back as you bring your knees back to the starting position. Repeat the exercise to the other side. Repeat __________ times. Complete this exercise __________ times per day  EXTENSION RANGE OF MOTION AND FLEXIBILITY EXERCISES: STRETCH  Extension, Prone on Elbows   Lie on your stomach on the floor, a bed will be too soft. Place your palms about shoulder width apart and at the height of your head.  Place your elbows under your shoulders. If this is too painful, stack pillows under your chest.  Allow your body to relax so that your hips drop lower and make contact more  completely with the floor.  Hold this position for __________ seconds.  Slowly return to lying flat on the floor. Repeat __________ times. Complete this exercise __________ times per day.  RANGE OF MOTION  Extension, Prone Press Ups  Lie on your stomach on the floor, a bed will be too soft. Place your palms about shoulder width apart and at the height of your head.  Keeping your back as relaxed as possible, slowly straighten your elbows while keeping your hips on the floor. You may adjust the placement of your hands to maximize your comfort. As you gain motion, your hands will come more underneath your shoulders.  Hold this position __________ seconds.  Slowly return to lying flat on the floor. Repeat __________ times. Complete this exercise __________ times per day.  RANGE OF MOTION- Quadruped, Neutral Spine   Assume a hands and knees position on a firm surface. Keep your hands under your shoulders and your knees under your hips. You may place padding under your knees for comfort.  Drop your head and point your tail bone toward the ground below you. This will round out  your lower back like an angry cat. Hold this position for __________ seconds.  Slowly lift your head and release your tail bone so that your back sags into a large arch, like an old horse.  Hold this position for __________ seconds.  Repeat this until you feel limber in your lower back.  Now, find your "sweet spot." This will be the most comfortable position somewhere between the two previous positions. This is your neutral spine. Once you have found this position, tense your stomach muscles to support your lower back.  Hold this position for __________ seconds. Repeat __________ times. Complete this exercise __________ times per day.  STRENGTHENING EXERCISES - Low Back Strain These exercises may help you when beginning to rehabilitate your injury. These exercises should be done near your "sweet spot." This is the neutral, low-back arch, somewhere between fully rounded and fully arched, that is your least painful position. When performed in this safe range of motion, these exercises can be used for people who have either a flexion or extension based injury. These exercises may resolve your symptoms with or without further involvement from your physician, physical therapist or athletic trainer. While completing these exercises, remember:   Muscles can gain both the endurance and the strength needed for everyday activities through controlled exercises.  Complete these exercises as instructed by your physician, physical therapist or athletic trainer. Increase the resistance and repetitions only as guided.  You may experience muscle soreness or fatigue, but the pain or discomfort you are trying to eliminate should never worsen during these exercises. If this pain does worsen, stop and make certain you are following the directions exactly. If the pain is still present after adjustments, discontinue the exercise until you can discuss the trouble with your caregiver. STRENGTHENING Deep Abdominals,  Pelvic Tilt  Lie on a firm bed or floor. Keeping your legs in front of you, bend your knees so they are both pointed toward the ceiling and your feet are flat on the floor.  Tense your lower abdominal muscles to press your lower back into the floor. This motion will rotate your pelvis so that your tail bone is scooping upwards rather than pointing at your feet or into the floor.  With a gentle tension and even breathing, hold this position for __________ seconds. Repeat __________ times. Complete this exercise __________ times per day.  STRENGTHENING  Abdominals, Crunches   Lie on a firm bed or floor. Keeping your legs in front of you, bend your knees so they are both pointed toward the ceiling and your feet are flat on the floor. Cross your arms over your chest.  Slightly tip your chin down without bending your neck.  Tense your abdominals and slowly lift your trunk high enough to just clear your shoulder blades. Lifting higher can put excessive stress on the lower back and does not further strengthen your abdominal muscles.  Control your return to the starting position. Repeat __________ times. Complete this exercise __________ times per day.  STRENGTHENING  Quadruped, Opposite UE/LE Lift   Assume a hands and knees position on a firm surface. Keep your hands under your shoulders and your knees under your hips. You may place padding under your knees for comfort.  Find your neutral spine and gently tense your abdominal muscles so that you can maintain this position. Your shoulders and hips should form a rectangle that is parallel with the floor and is not twisted.  Keeping your trunk steady, lift your right hand no higher than your shoulder and then your left leg no higher than your hip. Make sure you are not holding your breath. Hold this position __________ seconds.  Continuing to keep your abdominal muscles tense and your back steady, slowly return to your starting position. Repeat with  the opposite arm and leg. Repeat __________ times. Complete this exercise __________ times per day.  STRENGTHENING  Lower Abdominals, Double Knee Lift  Lie on a firm bed or floor. Keeping your legs in front of you, bend your knees so they are both pointed toward the ceiling and your feet are flat on the floor.  Tense your abdominal muscles to brace your lower back and slowly lift both of your knees until they come over your hips. Be certain not to hold your breath.  Hold __________ seconds. Using your abdominal muscles, return to the starting position in a slow and controlled manner. Repeat __________ times. Complete this exercise __________ times per day.  POSTURE AND BODY MECHANICS CONSIDERATIONS - Low Back Strain Keeping correct posture when sitting, standing or completing your activities will reduce the stress put on different body tissues, allowing injured tissues a chance to heal and limiting painful experiences. The following are general guidelines for improved posture. Your physician or physical therapist will provide you with any instructions specific to your needs. While reading these guidelines, remember:  The exercises prescribed by your provider will help you have the flexibility and strength to maintain correct postures.  The correct posture provides the best environment for your joints to work. All of your joints have less wear and tear when properly supported by a spine with good posture. This means you will experience a healthier, less painful body.  Correct posture must be practiced with all of your activities, especially prolonged sitting and standing. Correct posture is as important when doing repetitive low-stress activities (typing) as it is when doing a single heavy-load activity (lifting). RESTING POSITIONS Consider which positions are most painful for you when choosing a resting position. If you have pain with flexion-based activities (sitting, bending, stooping,  squatting), choose a position that allows you to rest in a less flexed posture. You would want to avoid curling into a fetal position on your side. If your pain worsens with extension-based activities (prolonged standing, working overhead), avoid resting in an extended position such as sleeping on your stomach. Most  people will find more comfort when they rest with their spine in a more neutral position, neither too rounded nor too arched. Lying on a non-sagging bed on your side with a pillow between your knees, or on your back with a pillow under your knees will often provide some relief. Keep in mind, being in any one position for a prolonged period of time, no matter how correct your posture, can still lead to stiffness. PROPER SITTING POSTURE In order to minimize stress and discomfort on your spine, you must sit with correct posture. Sitting with good posture should be effortless for a healthy body. Returning to good posture is a gradual process. Many people can work toward this most comfortably by using various supports until they have the flexibility and strength to maintain this posture on their own. When sitting with proper posture, your ears will fall over your shoulders and your shoulders will fall over your hips. You should use the back of the chair to support your upper back. Your lower back will be in a neutral position, just slightly arched. You may place a small pillow or folded towel at the base of your lower back for support.  When working at a desk, create an environment that supports good, upright posture. Without extra support, muscles tire, which leads to excessive strain on joints and other tissues. Keep these recommendations in mind: CHAIR:  A chair should be able to slide under your desk when your back makes contact with the back of the chair. This allows you to work closely.  The chair's height should allow your eyes to be level with the upper part of your monitor and your hands to  be slightly lower than your elbows. BODY POSITION  Your feet should make contact with the floor. If this is not possible, use a foot rest.  Keep your ears over your shoulders. This will reduce stress on your neck and lower back. INCORRECT SITTING POSTURES  If you are feeling tired and unable to assume a healthy sitting posture, do not slouch or slump. This puts excessive strain on your back tissues, causing more damage and pain. Healthier options include:  Using more support, like a lumbar pillow.  Switching tasks to something that requires you to be upright or walking.  Talking a brief walk.  Lying down to rest in a neutral-spine position. PROLONGED STANDING WHILE SLIGHTLY LEANING FORWARD  When completing a task that requires you to lean forward while standing in one place for a long time, place either foot up on a stationary 2-4 inch high object to help maintain the best posture. When both feet are on the ground, the lower back tends to lose its slight inward curve. If this curve flattens (or becomes too large), then the back and your other joints will experience too much stress, tire more quickly, and can cause pain. CORRECT STANDING POSTURES Proper standing posture should be assumed with all daily activities, even if they only take a few moments, like when brushing your teeth. As in sitting, your ears should fall over your shoulders and your shoulders should fall over your hips. You should keep a slight tension in your abdominal muscles to brace your spine. Your tailbone should point down to the ground, not behind your body, resulting in an over-extended swayback posture.  INCORRECT STANDING POSTURES  Common incorrect standing postures include a forward head, locked knees and/or an excessive swayback. WALKING Walk with an upright posture. Your ears, shoulders and hips should all  line-up. PROLONGED ACTIVITY IN A FLEXED POSITION When completing a task that requires you to bend forward at  your waist or lean over a low surface, try to find a way to stabilize 3 out of 4 of your limbs. You can place a hand or elbow on your thigh or rest a knee on the surface you are reaching across. This will provide you more stability so that your muscles do not fatigue as quickly. By keeping your knees relaxed, or slightly bent, you will also reduce stress across your lower back. CORRECT LIFTING TECHNIQUES DO :   Assume a wide stance. This will provide you more stability and the opportunity to get as close as possible to the object which you are lifting.  Tense your abdominals to brace your spine. Bend at the knees and hips. Keeping your back locked in a neutral-spine position, lift using your leg muscles. Lift with your legs, keeping your back straight.  Test the weight of unknown objects before attempting to lift them.  Try to keep your elbows locked down at your sides in order get the best strength from your shoulders when carrying an object.  Always ask for help when lifting heavy or awkward objects. INCORRECT LIFTING TECHNIQUES DO NOT:   Lock your knees when lifting, even if it is a small object.  Bend and twist. Pivot at your feet or move your feet when needing to change directions.  Assume that you can safely pick up even a paper clip without proper posture. Document Released: 12/16/2005 Document Revised: 03/09/2012 Document Reviewed: 03/30/2009 [facility] Patient Information 2014 [place], [place].  Muscle Strain A muscle strain is an injury that occurs when a muscle is stretched beyond its normal length. Usually a small number of muscle fibers are torn when this happens. Muscle strain is rated in degrees. First-degree strains have the least amount of muscle fiber tearing and pain. Second-degree and third-degree strains have increasingly more tearing and pain.  Usually, recovery from muscle strain takes 1 2 weeks. Complete healing takes 5 6 weeks.  CAUSES  Muscle strain happens when a  sudden, violent force placed on a muscle stretches it too far. This may occur with lifting, sports, or a fall.  RISK FACTORS Muscle strain is especially common in athletes.  SIGNS AND SYMPTOMS At the site of the muscle strain, there may be:  Pain.  Bruising.  Swelling.  Difficulty using the muscle due to pain or lack of normal function. DIAGNOSIS  Your health care provider will perform a physical exam and ask about your medical history. TREATMENT  Often, the best treatment for a muscle strain is resting, icing, and applying cold compresses to the injured area.  HOME CARE INSTRUCTIONS   Use the PRICE method of treatment to promote muscle healing during the first 2 3 days after your injury. The PRICE method involves:  Protecting the muscle from being injured again.  Restricting your activity and resting the injured body part.  Icing your injury. To do this, put ice in a plastic bag. Place a towel between your skin and the bag. Then, apply the ice and leave it on from 15 20 minutes each hour. After the third day, switch to moist heat packs.  Apply compression to the injured area with a splint or elastic bandage. Be careful not to wrap it too tightly. This may interfere with blood circulation or increase swelling.  Elevate the injured body part above the level of your heart as often as you can.  Only take over-the-counter or prescription medicines for pain, discomfort, or fever as directed by your health care provider.  Warming up prior to exercise helps to prevent future muscle strains. SEEK MEDICAL CARE IF:   You have increasing pain or swelling in the injured area.  You have numbness, tingling, or a significant loss of strength in the injured area. MAKE SURE YOU:   Understand these instructions.  Will watch your condition.  Will get help right away if you are not doing well or get worse. Document Released: 12/16/2005 Document Revised: 10/06/2013 Document Reviewed:  07/15/2013 [facility] Patient Information 2014 [place], [place].
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
## 2014-01-13 NOTE — ED Provider Notes (Signed)
Medical screening examination/treatment/procedure(s) were performed by resident physician or non-physician practitioner and as supervising physician I was immediately available for consultation/collaboration.   KINDL,JAMES DOUGLAS MD.   James D Kindl, MD 01/13/14 2053 

## 2014-04-19 ENCOUNTER — Encounter (HOSPITAL_COMMUNITY): Payer: Self-pay | Admitting: Emergency Medicine

## 2014-04-19 ENCOUNTER — Emergency Department (INDEPENDENT_AMBULATORY_CARE_PROVIDER_SITE_OTHER)
Admission: EM | Admit: 2014-04-19 | Discharge: 2014-04-19 | Disposition: A | Discharge status: Home / Self Care | Payer: Self-pay | Source: Home / Self Care | Attending: Emergency Medicine | Admitting: Emergency Medicine

## 2014-04-19 DIAGNOSIS — B351 Tinea unguium: Secondary | ICD-10-CM

## 2014-04-19 DIAGNOSIS — H1030 Unspecified acute conjunctivitis, unspecified eye: Secondary | ICD-10-CM

## 2014-04-19 MED ORDER — POLYMYXIN B-TRIMETHOPRIM 10000-0.1 UNIT/ML-% OP SOLN: 1.0000 [drp] | OPHTHALMIC | Status: DC

## 2014-04-19 MED ORDER — CICLOPIROX 8 % EX SOLN: Freq: Every day | CUTANEOUS | Status: DC

## 2014-04-19 NOTE — ED Notes (Signed)
Patient complains of pink eye that started Saturday with redness, burning, watering; also complains of black deformed toe nail right great toe.

## 2014-04-19 NOTE — Discharge Instructions (Signed)
Conjunctivitis Conjunctivitis is commonly called "pink eye." Conjunctivitis can be caused by bacterial or viral infection, allergies, or injuries. There is usually redness of the lining of the eye, itching, discomfort, and sometimes discharge. There may be deposits of matter along the eyelids. A viral infection usually causes a watery discharge, while a bacterial infection causes a yellowish, thick discharge. Pink eye is very contagious and spreads by direct contact. You may be given antibiotic eyedrops as part of your treatment. Before using your eye medicine, remove all drainage from the eye by washing gently with warm water and cotton balls. Continue to use the medication until you have awakened 2 mornings in a row without discharge from the eye. Do not rub your eye. This increases the irritation and helps spread infection. Use separate towels from other household members. Wash your hands with soap and water before and after touching your eyes. Use cold compresses to reduce pain and sunglasses to relieve irritation from light. Do not wear contact lenses or wear eye makeup until the infection is gone. SEEK MEDICAL CARE IF:   Your symptoms are not better after 3 days of treatment.  You have increased pain or trouble seeing.  The outer eyelids become very red or swollen. Document Released: 01/23/2005 Document Revised: 03/09/2012 Document Reviewed: 12/16/2005 [facility] Patient Information 2014 [place], [place].  Ringworm, Nail A fungal infection of the nail (tinea unguium/onychomycosis) is common. It is common as the visible part of the nail is composed of dead cells which have no blood supply to help prevent infection. It occurs because fungi are everywhere and will pick any opportunity to grow on any dead material. Because nails are very slow growing they require up to 2 years of treatment with anti-fungal medications. The entire nail back to the base is infected. This includes approximately  of the  nail which you cannot see. If your caregiver has prescribed a medication by mouth, take it every day and as directed. No progress will be seen for at least 6 to 9 months. Do not be disappointed! Because fungi live on dead cells with little or no exposure to blood supply, medication delivery to the infection is slow; thus the cure is slow. It is also why you can observe no progress in the first 6 months. The nail becoming cured is the base of the nail, as it has the blood supply. Topical medication such as creams and ointments are usually not effective. Important in successful treatment of nail fungus is closely following the medication regimen that your doctor prescribes. Sometimes you and your caregiver may elect to speed up this process by surgical removal of all the nails. Even this may still require 6 to 9 months of additional oral medications. See your caregiver as directed. Remember there will be no visible improvement for at least 6 months. See your caregiver sooner if other signs of infection (redness and swelling) develop. Document Released: 12/13/2000 Document Revised: 03/09/2012 Document Reviewed: 02/21/2009 [facility] Patient Information 2014 [place], [place].

## 2014-04-19 NOTE — ED Provider Notes (Signed)
CSN: [id number]     Arrival date & time 04/19/14  1100 History   First MD Initiated Contact with Patient 04/19/14 1259     Chief Complaint  Patient presents with  . Conjunctivitis  . Nail Problem   (Consider location/radiation/quality/duration/timing/severity/associated sxs/prior Treatment) HPI Comments: [age]m presents c/o red, itchy eyes.  This started in the right eye 3 days ago and the left eye started today.  The eye is red and itchy, with crusting and purulent drainage.  No pain in the eyes or blurry vision.  Also he has a thick, darkened toenail for many years.  He just wanted to ask if he should do something about it bc he is here.  No pain or swelling or the toe itself, no drainage.  No injury.     History reviewed. No pertinent past medical history. Past Surgical History  Procedure Laterality Date  . Arm surgery  2012    left arm fracture repair   No family history on file. History  Substance Use Topics  . Smoking status: Never Smoker   . Smokeless tobacco: Not on file  . Alcohol Use: Yes    Review of Systems  Constitutional: Negative for fever and chills.  Eyes: Positive for pain, discharge, redness and itching. Negative for photophobia and visual disturbance.  Musculoskeletal:       Toenail darkness and thickening   All other systems reviewed and are negative.   Allergies  Penicillins  Home Medications   Prior to Admission medications   Medication Sig Start Date End Date Taking? Authorizing Provider  diclofenac (CATAFLAM) 50 MG tablet Take 1 tablet (50 mg total) by mouth 3 (three) times daily. Prn muscle pain 01/11/14   Hayden Rasmussenavid Mabe, NP  traMADol (ULTRAM) 50 MG tablet Take 1 tablet (50 mg total) by mouth every 6 (six) hours as needed. 01/11/14   Hayden Rasmussenavid Mabe, NP   BP 146/87  Pulse 80  Temp(Src) 98.3 F (36.8 C) (Oral)  Resp 18  SpO2 99% Physical Exam  Nursing note and vitals reviewed. Constitutional: He is oriented to person, place, and time. He appears  well-developed and well-nourished. No distress.  HENT:  Head: Normocephalic.  Eyes: EOM are normal. Pupils are equal, round, and reactive to light. Right eye exhibits discharge. Left eye exhibits discharge. Right conjunctiva is injected. Left conjunctiva is injected.  Pulmonary/Chest: Effort normal. No respiratory distress.  Musculoskeletal:       Feet:  Neurological: He is alert and oriented to person, place, and time. Coordination normal.  Skin: Skin is warm and dry. No rash noted. He is not diaphoretic.  Psychiatric: He has a normal mood and affect. Judgment normal.    ED Course  Procedures (including critical care time) Labs Review Labs Reviewed - No data to display   Imaging Review No results found.   MDM   1. Acute conjunctivitis   2. Onychomycosis    Tx conjunctivitis with polytrim drops, f/u PRN if not improving   Discussed treatment options for onychomycosis, hehas elected to not pursue treatment at this time. I will provide a prescription for the topical antifungal treatment if he changes his mind.   Meds ordered this encounter  Medications  . trimethoprim-polymyxin b (POLYTRIM) ophthalmic solution    Sig: Place 1 drop into both eyes every 3 (three) hours. While awake, up to 6 doses per day    Dispense:  10 mL    Refill:  0    Order Specific Question:  Supervising Provider  Answer:  Linna HoffKINDL, JAMES D [[id number]]  . ciclopirox (PENLAC) 8 % solution    Sig: Apply topically at bedtime. Apply over nail and surrounding skin. Apply daily over previous coat. After seven (7) days, may remove with alcohol and continue cycle.    Dispense:  6.6 mL    Refill:  2    Order Specific Question:  Supervising Provider    Answer:  Bradd CanaryKINDL, JAMES D [[id number]]       Jaime GoodZachary H Reece Mcbroom, PA-C 04/19/14 1414

## 2014-04-20 NOTE — ED Provider Notes (Signed)
Medical screening examination/treatment/procedure(s) were performed by non-physician practitioner and as supervising physician I was immediately available for consultation/collaboration.  Leslee Homeavid Monte Zinni, M.D.  Reuben Likesavid C Mikias Lanz, MD 04/20/14 [phone]

## 2017-03-07 ENCOUNTER — Other Ambulatory Visit: Payer: Self-pay | Admitting: Internal Medicine

## 2017-03-07 DIAGNOSIS — R945 Abnormal results of liver function studies: Principal | ICD-10-CM

## 2017-03-07 DIAGNOSIS — R7989 Other specified abnormal findings of blood chemistry: Secondary | ICD-10-CM

## 2017-03-14 ENCOUNTER — Other Ambulatory Visit: Payer: Self-pay

## 2017-03-27 ENCOUNTER — Other Ambulatory Visit: Payer: Self-pay

## 2017-04-01 ENCOUNTER — Ambulatory Visit
Admission: RE | Admit: 2017-04-01 | Discharge: 2017-04-01 | Disposition: A | Discharge status: Home / Self Care | Payer: BLUE CROSS/BLUE SHIELD | Source: Ambulatory Visit | Attending: Internal Medicine | Admitting: Internal Medicine

## 2017-04-01 DIAGNOSIS — R7989 Other specified abnormal findings of blood chemistry: Secondary | ICD-10-CM

## 2017-04-01 DIAGNOSIS — R945 Abnormal results of liver function studies: Principal | ICD-10-CM

## 2018-08-04 ENCOUNTER — Other Ambulatory Visit: Payer: Self-pay

## 2018-08-04 ENCOUNTER — Ambulatory Visit (HOSPITAL_COMMUNITY)
Admission: EM | Admit: 2018-08-04 | Discharge: 2018-08-04 | Disposition: A | Discharge status: Home / Self Care | Payer: BLUE CROSS/BLUE SHIELD | Attending: Family Medicine | Admitting: Family Medicine

## 2018-08-04 ENCOUNTER — Encounter (HOSPITAL_COMMUNITY): Payer: Self-pay

## 2018-08-04 DIAGNOSIS — M546 Pain in thoracic spine: Secondary | ICD-10-CM | POA: Diagnosis not present

## 2018-08-04 DIAGNOSIS — R0789 Other chest pain: Secondary | ICD-10-CM

## 2018-08-04 DIAGNOSIS — M542 Cervicalgia: Secondary | ICD-10-CM

## 2018-08-04 MED ORDER — CYCLOBENZAPRINE HCL 5 MG PO TABS: 5.0000 mg | ORAL_TABLET | Freq: Every evening | ORAL | 0 refills | Status: DC | PRN

## 2018-08-04 MED ORDER — MELOXICAM 7.5 MG PO TABS: 7.5000 mg | ORAL_TABLET | Freq: Every day | ORAL | 0 refills | Status: DC
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
## 2018-08-04 NOTE — ED Triage Notes (Signed)
Pt c/o back pain and neck pain some chest pain this happened yesterday.

## 2018-10-21 ENCOUNTER — Emergency Department (HOSPITAL_COMMUNITY)
Admission: EM | Admit: 2018-10-21 | Discharge: 2018-10-22 | Disposition: A | Discharge status: Home / Self Care | Payer: BLUE CROSS/BLUE SHIELD | Attending: Emergency Medicine | Admitting: Emergency Medicine

## 2018-10-21 ENCOUNTER — Encounter (HOSPITAL_COMMUNITY): Payer: Self-pay

## 2018-10-21 DIAGNOSIS — Z202 Contact with and (suspected) exposure to infections with a predominantly sexual mode of transmission: Secondary | ICD-10-CM | POA: Diagnosis present

## 2018-10-21 DIAGNOSIS — Z79899 Other long term (current) drug therapy: Secondary | ICD-10-CM | POA: Insufficient documentation

## 2018-10-21 DIAGNOSIS — I10 Essential (primary) hypertension: Secondary | ICD-10-CM | POA: Diagnosis not present

## 2018-10-21 HISTORY — DX: Essential (primary) hypertension: I10

## 2018-10-21 LAB — URINALYSIS, ROUTINE W REFLEX MICROSCOPIC
BILIRUBIN URINE: NEGATIVE
Bacteria, UA: NONE SEEN
GLUCOSE, UA: NEGATIVE mg/dL
KETONES UR: 80 mg/dL — AB
LEUKOCYTES UA: NEGATIVE
NITRITE: NEGATIVE
PROTEIN: 100 mg/dL — AB
Specific Gravity, Urine: 1.021 (ref 1.005–1.030)
pH: 6 (ref 5.0–8.0)

## 2018-10-21 MED ORDER — AZITHROMYCIN 250 MG PO TABS
1000.0000 mg | ORAL_TABLET | Freq: Once | ORAL | Status: AC
Start: 2018-10-22 — End: 2018-10-22
  Administered 2018-10-22: 1000 mg via ORAL
  Filled 2018-10-21: qty 4

## 2018-10-21 MED ORDER — CEFTRIAXONE SODIUM 250 MG IJ SOLR
250.0000 mg | Freq: Once | INTRAMUSCULAR | Status: AC
  Administered 2018-10-22: 250 mg via INTRAMUSCULAR
  Filled 2018-10-21: qty 250

## 2018-10-21 NOTE — Discharge Instructions (Addendum)
Today you were treated for gonorrhea and chlamydia, please refrain from sexual intercourse for the next 10 days. ° ° ° °

## 2018-10-21 NOTE — ED Triage Notes (Signed)
Pt states that he wants to be checked for STD's , denies any symptoms just wants to be checked, pt then added he may have seen something in his urine
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
## 2018-10-22 LAB — GC/CHLAMYDIA PROBE AMP (~~LOC~~) NOT AT ARMC
CHLAMYDIA, DNA PROBE: NEGATIVE
Neisseria Gonorrhea: NEGATIVE

## 2018-10-22 MED ORDER — STERILE WATER FOR INJECTION IJ SOLN
INTRAMUSCULAR | Status: AC
  Administered 2018-10-22: 0.9 mL
  Filled 2018-10-22: qty 10

## 2019-02-09 ENCOUNTER — Emergency Department (HOSPITAL_COMMUNITY)
Admission: EM | Admit: 2019-02-09 | Discharge: 2019-02-09 | Disposition: A | Discharge status: Home / Self Care | Payer: BLUE CROSS/BLUE SHIELD | Attending: Emergency Medicine | Admitting: Emergency Medicine

## 2019-02-09 ENCOUNTER — Ambulatory Visit (INDEPENDENT_AMBULATORY_CARE_PROVIDER_SITE_OTHER)
Admission: EM | Admit: 2019-02-09 | Discharge: 2019-02-09 | Disposition: A | Discharge status: Home / Self Care | Payer: BLUE CROSS/BLUE SHIELD | Source: Home / Self Care | Attending: Family Medicine | Admitting: Family Medicine

## 2019-02-09 ENCOUNTER — Encounter (HOSPITAL_COMMUNITY): Payer: Self-pay

## 2019-02-09 ENCOUNTER — Other Ambulatory Visit: Payer: Self-pay

## 2019-02-09 ENCOUNTER — Encounter (HOSPITAL_COMMUNITY): Payer: Self-pay | Admitting: Emergency Medicine

## 2019-02-09 DIAGNOSIS — F1010 Alcohol abuse, uncomplicated: Secondary | ICD-10-CM | POA: Diagnosis not present

## 2019-02-09 DIAGNOSIS — B349 Viral infection, unspecified: Secondary | ICD-10-CM | POA: Diagnosis not present

## 2019-02-09 DIAGNOSIS — R05 Cough: Secondary | ICD-10-CM | POA: Insufficient documentation

## 2019-02-09 DIAGNOSIS — R030 Elevated blood-pressure reading, without diagnosis of hypertension: Secondary | ICD-10-CM

## 2019-02-09 DIAGNOSIS — I10 Essential (primary) hypertension: Secondary | ICD-10-CM | POA: Diagnosis not present

## 2019-02-09 DIAGNOSIS — F10239 Alcohol dependence with withdrawal, unspecified: Secondary | ICD-10-CM

## 2019-02-09 DIAGNOSIS — F10939 Alcohol use, unspecified with withdrawal, unspecified: Secondary | ICD-10-CM

## 2019-02-09 DIAGNOSIS — M7918 Myalgia, other site: Secondary | ICD-10-CM | POA: Diagnosis present

## 2019-02-09 LAB — CBC
HCT: 51.1 % (ref 39.0–52.0)
Hemoglobin: 16.5 g/dL (ref 13.0–17.0)
MCH: 29.1 pg (ref 26.0–34.0)
MCHC: 32.3 g/dL (ref 30.0–36.0)
MCV: 90.1 fL (ref 80.0–100.0)
Platelets: 242 10*3/uL (ref 150–400)
RBC: 5.67 MIL/uL (ref 4.22–5.81)
RDW: 13.1 % (ref 11.5–15.5)
WBC: 4.8 10*3/uL (ref 4.0–10.5)
nRBC: 0 % (ref 0.0–0.2)

## 2019-02-09 LAB — COMPREHENSIVE METABOLIC PANEL
ALT: 26 U/L (ref 0–44)
AST: 41 U/L (ref 15–41)
Albumin: 4.3 g/dL (ref 3.5–5.0)
Alkaline Phosphatase: 35 U/L — ABNORMAL LOW (ref 38–126)
Anion gap: 21 — ABNORMAL HIGH (ref 5–15)
BUN: 9 mg/dL (ref 6–20)
CO2: 18 mmol/L — ABNORMAL LOW (ref 22–32)
Calcium: 9.2 mg/dL (ref 8.9–10.3)
Chloride: 96 mmol/L — ABNORMAL LOW (ref 98–111)
Creatinine, Ser: 1.17 mg/dL (ref 0.61–1.24)
GFR calc Af Amer: >60 mL/min (ref >60)
GFR calc non Af Amer: >60 mL/min (ref >60)
Glucose, Bld: 88 mg/dL (ref 70–99)
Potassium: 4.2 mmol/L (ref 3.5–5.1)
Sodium: 135 mmol/L (ref 135–145)
Total Bilirubin: 3 mg/dL — ABNORMAL HIGH (ref 0.3–1.2)
Total Protein: 7.5 g/dL (ref 6.5–8.1)

## 2019-02-09 LAB — RAPID URINE DRUG SCREEN, HOSP PERFORMED
Amphetamines: NOT DETECTED
Barbiturates: NOT DETECTED
Benzodiazepines: NOT DETECTED
Cocaine: NOT DETECTED
Opiates: NOT DETECTED
Tetrahydrocannabinol: NOT DETECTED

## 2019-02-09 LAB — ETHANOL: Alcohol, Ethyl (B): <10 mg/dL (ref <10)

## 2019-02-09 MED ORDER — LORAZEPAM 1 MG PO TABS
0.5000 mg | ORAL_TABLET | Freq: Once | ORAL | Status: AC
  Administered 2019-02-09: 0.5 mg via ORAL
  Filled 2019-02-09: qty 1

## 2019-02-09 MED ORDER — IBUPROFEN 800 MG PO TABS
800.0000 mg | ORAL_TABLET | Freq: Once | ORAL | Status: AC
  Administered 2019-02-09: 800 mg via ORAL
  Filled 2019-02-09: qty 1

## 2019-02-09 MED ORDER — LISINOPRIL 10 MG PO TABS: 10.0000 mg | ORAL_TABLET | Freq: Every day | ORAL | 1 refills | Status: DC

## 2019-02-09 MED ORDER — CLONIDINE HCL 0.1 MG PO TABS
ORAL_TABLET | ORAL | Status: AC
  Filled 2019-02-09: qty 1

## 2019-02-09 MED ORDER — CLONIDINE HCL 0.1 MG PO TABS: 0.1000 mg | ORAL_TABLET | Freq: Once | ORAL | Status: DC

## 2019-02-09 NOTE — ED Provider Notes (Signed)
[facility]   [id number] 02/09/19 Arrival Time: 1302   CC: Leg pain, and cold sweats  SUBJECTIVE: History from: patient.  Jaime Barton is a 38 y.o. male hx significant for HTN, who presents with bilateral leg pain and cold sweats x 1 day. Admits to recent travel from [facility].  States he did a lot of walking and drinking while he was there.  Admits to drinking approximately 2 pints of vodka daily.  Normally drinks 1 pint daily.  Has not had anything to drink today due to urine drug screen with parole officer.  Has tried ibuprofen without relief.  Denies aggravating factors.  Denies previous symptoms in the past.   Denies fever, fatigue, sinus pain, rhinorrhea, sore throat, SOB, wheezing, chest pain, nausea, vomiting, changes in bowel or bladder habits, saddle paresthesias, weakness in extremities.    ROS: As per HPI.  Past Medical History:  Diagnosis Date  . Hypertension    Past Surgical History:  Procedure Laterality Date  . arm surgery  2012   left arm fracture repair   Allergies  Allergen Reactions  . Penicillins    No current facility-administered medications on file prior to encounter.    No current outpatient medications on file prior to encounter.   Social History   Socioeconomic History  . Marital status: Single    Spouse name: Not on file  . Number of children: Not on file  . Years of education: Not on file  . Highest education level: Not on file  Occupational History  . Not on file  Social Needs  . Financial resource strain: Not on file  . Food insecurity:    Worry: Not on file    Inability: Not on file  . Transportation needs:    Medical: Not on file    Non-medical: Not on file  Tobacco Use  . Smoking status: Never Smoker  . Smokeless tobacco: Never Used  Substance and Sexual Activity  . Alcohol use: Yes  . Drug use: No  . Sexual activity: Not on file  Lifestyle  . Physical activity:    Days per week: Not on file    Minutes per  session: Not on file  . Stress: Not on file  Relationships  . Social connections:    Talks on phone: Not on file    Gets together: Not on file    Attends religious service: Not on file    Active member of club or organization: Not on file    Attends meetings of clubs or organizations: Not on file    Relationship status: Not on file  . Intimate partner violence:    Fear of current or ex partner: Not on file    Emotionally abused: Not on file    Physically abused: Not on file    Forced sexual activity: Not on file  Other Topics Concern  . Not on file  Social History Narrative  . Not on file   Family History  Problem Relation Age of Onset  . Healthy Mother   . Healthy Father     OBJECTIVE:  Vitals:   02/09/19 1404  BP: (!) 190/116  Pulse: 91  Resp: 18  Temp: (!) 97.1 F (36.2 C)  TempSrc: Temporal  SpO2: 100%     General appearance: alert; appears anxious HEENT: NCAT; Ears: EACs clear, TMs pearly gray; Eyes: PERRL.  EOM grossly intact. Sinuses: nontender; Nose: nares patent without rhinorrhea, Throat: oropharynx clear, tonsils non erythematous or enlarged,  uvula midline  Neck: FROM Lungs: CTAB Heart: Tachycardic Skin: moist and clammy; sweat visible on forehead Neuro: strength and sensation intact about the upper and lower extremities; resting tremor evident RT hand Psychological: alert and cooperative; anxious mood and affect  MDM:  Pt presenting with bilateral leg pain and cold sweats x 1 day.  Symptoms began after returning from [facility].  Admits to drinking approximately 2 pints of vodka daily.  Normally drinks 1 pint daily.  Has not drank today.  Hypertensive and tachycardic on exam.  Skin moist with visible sweat on forehead.  Resting tremor RT hand, strength and sensation intact about the UE and LE.  Lungs CTAB.  Discussed patient case with Dr. Tracie Harrier.  Patient appears to be going through alcohol withdrawal.  Recommending further evaluation and management in the  ED.  Pt aware and in agreement with this plan.  Declines wheelchair, but escorted with Onalee Hua, [employer].    ASSESSMENT & PLAN:  1. Alcohol withdrawal syndrome with complication (HCC)   2. Elevated blood pressure reading     Meds ordered this encounter  Medications  . DISCONTD: cloNIDine (CATAPRES) tablet 0.1 mg    Recommending further evaluation and management in the ED for possible alcohol withdrawal.  Patient aware and in agreement with this plan.            Rennis Harding, PA-C 02/09/19 1519

## 2019-02-09 NOTE — Discharge Instructions (Signed)
Tylenol or ibuprofen for fever and bodyaches  Schedule to see a primary care Md for blood pressure management

## 2019-02-09 NOTE — ED Provider Notes (Signed)
[facility] Provider [facility]   CSN: [id number] Arrival date & time: 02/09/19  1515     History   Chief Complaint Chief Complaint  Patient presents with  . Generalized Body Aches  . Alcohol Problem    HPI Virgina Norfolklton V Weatherall is a 38 y.o. male.  The history is provided by the patient. No language interpreter was used.  Cough  Cough characteristics:  Non-productive Sputum characteristics:  Nondescript Severity:  Moderate Onset quality:  Gradual Duration:  2 days Timing:  Constant Progression:  Worsening Chronicity:  New Smoker: no   Relieved by:  Nothing Worsened by:  Nothing Ineffective treatments:  None tried Associated symptoms: no fever and no shortness of breath   Risk factors: recent travel   Pt reports he just returned from [place] and had a lot of alcohol.  Pt reports he has muscle aches and feels like he has the flu.  Pt is not interested in stopping drinking or treatment.  Pt has high blood pressure.  He was given clonidine at Urgent care.  Pt is out of blood pressure medications and does not have an Md   Past Medical History:  Diagnosis Date  . Hypertension     There are no active problems to display for this patient.   Past Surgical History:  Procedure Laterality Date  . arm surgery  2012   left arm fracture repair        Home Medications    Prior to Admission medications   Not on File    Family History Family History  Problem Relation Age of Onset  . Healthy Mother   . Healthy Father     Social History Social History   Tobacco Use  . Smoking status: Never Smoker  . Smokeless tobacco: Never Used  Substance Use Topics  . Alcohol use: Yes  . Drug use: No     Allergies   Penicillins   Review of Systems Review of Systems  Constitutional: Negative for fever.  Respiratory: Positive for cough. Negative for shortness of breath.   All other systems reviewed and are negative.    Physical  Exam Updated Vital Signs BP (!) 169/111 (BP Location: Right Arm)   Pulse 75   Temp 97.8 F (36.6 C)   Resp 18   SpO2 100%   Physical Exam Vitals signs and nursing note reviewed.  Constitutional:      Appearance: He is well-developed.  HENT:     Head: Normocephalic and atraumatic.     Right Ear: Tympanic membrane normal.     Left Ear: Tympanic membrane normal.     Nose: Nose normal.     Mouth/Throat:     Mouth: Mucous membranes are moist.  Eyes:     Conjunctiva/sclera: Conjunctivae normal.  Neck:     Musculoskeletal: Neck supple.  Cardiovascular:     Rate and Rhythm: Normal rate and regular rhythm.     Heart sounds: No murmur.  Pulmonary:     Effort: Pulmonary effort is normal. No respiratory distress.     Breath sounds: Normal breath sounds.  Abdominal:     Palpations: Abdomen is soft.     Tenderness: There is no abdominal tenderness.  Musculoskeletal: Normal range of motion.  Skin:    General: Skin is warm and dry.  Neurological:     General: No focal deficit present.     Mental Status: He is alert.  Psychiatric:        Mood  and Affect: Mood normal.      ED Treatments / Results  Labs (all labs ordered are listed, but only abnormal results are displayed) Labs Reviewed  COMPREHENSIVE METABOLIC PANEL - Abnormal; Notable for the following components:      Result Value   Chloride 96 (*)    CO2 18 (*)    Alkaline Phosphatase 35 (*)    Total Bilirubin 3.0 (*)    Anion gap 21 (*)    All other components within normal limits  ETHANOL  CBC  RAPID URINE DRUG SCREEN, HOSP PERFORMED    EKG None  Radiology No results found.  Procedures Procedures (including critical care time)  Medications Ordered in ED Medications  ibuprofen (ADVIL,MOTRIN) tablet 800 mg (has no administration in time range)  LORazepam (ATIVAN) tablet 0.5 mg (has no administration in time range)     Initial Impression / Assessment and Plan / ED Course  I have reviewed the triage vital  signs and the nursing notes.  Pertinent labs & imaging results that were available during my care of the patient were reviewed by me and considered in my medical decision making (see chart for details).     MDM  Pt looks good. Pt given rx for   Final Clinical Impressions(s) / ED Diagnoses   Final diagnoses:  Viral syndrome  Hypertension, unspecified type  Alcohol abuse    ED Discharge Orders         Ordered    lisinopril (PRINIVIL,ZESTRIL) 10 MG tablet  Daily     02/09/19 1808        An After Visit Summary was printed and given to the patient.    Osie Cheeks 02/09/19 Merrily Brittle    Raeford Razor, MD 02/10/19 1228

## 2019-02-09 NOTE — ED Triage Notes (Signed)
Pt sent her from [facility] for possible alcohol withdrawal. Pt reports he drinks a pint of liquor a day. Pt recently returned for [facility]. Pt is now having generalized body aches, denies n/v or headaches. Pt a.o. nad noted.

## 2019-02-09 NOTE — ED Notes (Signed)
No tremor noted. Pt denies any symptoms at this time. States he had a "chill" earlier but doesn't feel bad at this time.

## 2019-02-09 NOTE — ED Triage Notes (Addendum)
[facility] after getting back from [facility] [place] on Monday morning.  Pt states he was having soreness in his legs since he was there, states he was doing a lot of walking.  States now he is having a generalized 'tingling" sensation and "I wanted to come and get checked out".  Also c/o cold chills.  Denies cough or congestion.  Denies n/v/d.  States he has a hx of HTN (190/116 at triage), states he has not taken his medicines this week.  Also cannot recall the name of he takes it.

## 2019-02-09 NOTE — Discharge Instructions (Addendum)
Recommending further evaluation and management in the ED for possible alcohol withdrawal.  Patient aware and in agreement with this plan.

## 2019-02-17 ENCOUNTER — Ambulatory Visit (HOSPITAL_COMMUNITY)
Admission: EM | Admit: 2019-02-17 | Discharge: 2019-02-17 | Disposition: A | Discharge status: Home / Self Care | Payer: BLUE CROSS/BLUE SHIELD | Attending: Physician Assistant | Admitting: Physician Assistant

## 2019-02-17 ENCOUNTER — Encounter (HOSPITAL_COMMUNITY): Payer: Self-pay | Admitting: Emergency Medicine

## 2019-02-17 DIAGNOSIS — R197 Diarrhea, unspecified: Secondary | ICD-10-CM | POA: Diagnosis not present

## 2019-02-17 DIAGNOSIS — L739 Follicular disorder, unspecified: Secondary | ICD-10-CM

## 2019-02-17 MED ORDER — MUPIROCIN 2 % EX OINT: 1.0000 "application " | TOPICAL_OINTMENT | Freq: Two times a day (BID) | CUTANEOUS | 0 refills | Status: DC

## 2019-02-17 MED ORDER — DICYCLOMINE HCL 20 MG PO TABS
20.0000 mg | ORAL_TABLET | Freq: Two times a day (BID) | ORAL | 0 refills | Status: DC
Start: 2019-02-17 — End: 2020-06-12

## 2019-02-17 NOTE — ED Notes (Signed)
Patient able to ambulate independently  

## 2019-02-17 NOTE — Discharge Instructions (Addendum)
You can start bentyl to try and slow down diarrhea. Keep hydrated, you urine should be clear to pale yellow in color. Bland diet, advance as tolerated. Monitor for any worsening of symptoms, nausea or vomiting, worsening abdominal pain, fever, follow up for reevaluation needed.  Start bactroban to affected area. Warm compress. Refrain from picking the scab. Monitor for spreading redness, increased warmth, fever, follow up for reevaluation needed.

## 2019-02-17 NOTE — ED Provider Notes (Signed)
[facility]    CSN: [id number] Arrival date & time: 02/17/19  1040     History   Chief Complaint Chief Complaint  Patient presents with  . Diarrhea  . Sore    HPI Jaime Barton is a 38 y.o. male.   38 year old male comes in for multiple complaints.  3 day history of diarrhea. He has about 4-5 episodes of diarrhea each day.  Denies melena, hematochezia.  Denies nausea, vomiting.  Denies abdominal pain.  Denies URI symptoms such as cough, congestion, sore throat.  Denies fever, chills, night sweats.  Has not tried anything for the symptoms.  Has had a sore to the chin for the past few days.  States started picking at it, and had mild drainage.  Denies pain, itching, irritation.  Denies spreading erythema, warmth, swelling.  Denies fever, chills, night sweats.  Was worried about cold sores, and has been applying over-the-counter ointment to the area.     Past Medical History:  Diagnosis Date  . Hypertension     There are no active problems to display for this patient.   Past Surgical History:  Procedure Laterality Date  . arm surgery  2012   left arm fracture repair       Home Medications    Prior to Admission medications   Medication Sig Start Date End Date Taking? Authorizing Provider  dicyclomine (BENTYL) 20 MG tablet Take 1 tablet (20 mg total) by mouth 2 (two) times daily. 02/17/19   Cathie Hoops, Drenda Sobecki V, PA-C  lisinopril (PRINIVIL,ZESTRIL) 10 MG tablet Take 1 tablet (10 mg total) by mouth daily. Patient not taking: Reported on 02/17/2019 02/09/19 02/09/20  Elson Areas, PA-C  mupirocin ointment (BACTROBAN) 2 % Apply 1 application topically 2 (two) times daily. 02/17/19   Belinda Fisher, PA-C    Family History Family History  Problem Relation Age of Onset  . Healthy Mother   . Healthy Father     Social History Social History   Tobacco Use  . Smoking status: Never Smoker  . Smokeless tobacco: Never Used  Substance Use Topics  . Alcohol use: Yes  .  Drug use: No     Allergies   Penicillins   Review of Systems Review of Systems  Reason unable to perform ROS: See HPI as above.     Physical Exam Triage Vital Signs ED Triage Vitals  Enc Vitals Group     BP 02/17/19 1152 (!) 157/112     Pulse Rate 02/17/19 1152 77     Resp 02/17/19 1152 18     Temp 02/17/19 1152 98.6 F (37 C)     Temp src --      SpO2 02/17/19 1152 100 %     Weight --      Height --      Head Circumference --      Peak Flow --      Pain Score 02/17/19 1154 0     Pain Loc --      Pain Edu? --      Excl. in GC? --    No data found.  Updated Vital Signs BP (!) 157/112   Pulse 77   Temp 98.6 F (37 C)   Resp 18   SpO2 100%   Physical Exam Constitutional:      General: He is not in acute distress.    Appearance: He is well-developed. He is not ill-appearing, toxic-appearing or diaphoretic.  HENT:  Head: Normocephalic and atraumatic.     Comments: Difficulty exam as patient with facial hair. Dry crusting around follicles. No tenderness to palpation. Mild erythema without warmth. No fluctuance felt.  Cardiovascular:     Rate and Rhythm: Normal rate and regular rhythm.     Heart sounds: Normal heart sounds. No murmur. No friction rub. No gallop.   Pulmonary:     Effort: Pulmonary effort is normal. No respiratory distress.     Breath sounds: Normal breath sounds. No stridor. No wheezing, rhonchi or rales.  Abdominal:     General: Bowel sounds are normal.     Palpations: Abdomen is soft.     Tenderness: There is no abdominal tenderness. There is no right CVA tenderness, left CVA tenderness, guarding or rebound.  Skin:    General: Skin is warm and dry.  Neurological:     Mental Status: He is alert and oriented to person, place, and time.  Psychiatric:        Behavior: Behavior normal.        Judgment: Judgment normal.      UC Treatments / Results  Labs (all labs ordered are listed, but only abnormal results are displayed) Labs  Reviewed - No data to display  EKG None  Radiology No results found.  Procedures Procedures (including critical care time)  Medications Ordered in UC Medications - No data to display  Initial Impression / Assessment and Plan / UC Course  I have reviewed the triage vital signs and the nursing notes.  Pertinent labs & imaging results that were available during my care of the patient were reviewed by me and considered in my medical decision making (see chart for details).    No alarming signs on exam.  Will try Bentyl to help with diarrhea.  Other symptomatic treatment discussed.  Push fluids.  Return precautions given.  Difficult exam due to facial hair.  Question folliculitis versus impetigo.  Will provide Bactroban ointment as directed.  Warm compress.  Return precautions given.  Patient expresses understanding and agrees to plan.  Final Clinical Impressions(s) / UC Diagnoses   Final diagnoses:  Diarrhea, unspecified type  Folliculitis    ED Prescriptions    Medication Sig Dispense Auth. Provider   dicyclomine (BENTYL) 20 MG tablet Take 1 tablet (20 mg total) by mouth 2 (two) times daily. 20 tablet Belem Hintze V, PA-C   mupirocin ointment (BACTROBAN) 2 % Apply 1 application topically 2 (two) times daily. 22 g Threasa Alpha, [place] 02/17/19 1247

## 2019-02-17 NOTE — ED Triage Notes (Signed)
Pt c/o diarrhea x3 days, pt also c/o sore on the bottom of his chin for the last few days.

## 2019-08-31 ENCOUNTER — Emergency Department (HOSPITAL_COMMUNITY): Payer: BLUE CROSS/BLUE SHIELD

## 2019-08-31 ENCOUNTER — Other Ambulatory Visit: Payer: Self-pay

## 2019-08-31 ENCOUNTER — Emergency Department (HOSPITAL_COMMUNITY)
Admission: EM | Admit: 2019-08-31 | Discharge: 2019-08-31 | Disposition: A | Discharge status: Home / Self Care | Payer: BLUE CROSS/BLUE SHIELD | Attending: Emergency Medicine | Admitting: Emergency Medicine

## 2019-08-31 DIAGNOSIS — I10 Essential (primary) hypertension: Secondary | ICD-10-CM | POA: Diagnosis not present

## 2019-08-31 DIAGNOSIS — M25521 Pain in right elbow: Secondary | ICD-10-CM | POA: Diagnosis present

## 2019-08-31 MED ORDER — HYDROCODONE-ACETAMINOPHEN 5-325 MG PO TABS: 1.0000 | ORAL_TABLET | ORAL | 0 refills | Status: DC | PRN

## 2019-08-31 NOTE — ED Provider Notes (Signed)
[facility] Provider Note   CSN: [id number] Arrival date & time: 08/31/19  [id number]     History   Chief Complaint Chief Complaint  Patient presents with  . Arm Pain    HPI Jaime Barton is a 38 y.o. male.     The history is provided by the patient and medical records. No language interpreter was used.  Arm Injury Location:  Elbow Elbow location:  R elbow Injury: no   Pain details:    Quality:  Aching   Radiates to:  Does not radiate   Severity:  Moderate   Onset quality:  Sudden   Duration:  1 day   Timing:  Constant   Progression:  Unchanged Handedness:  Right-handed Dislocation: no   Foreign body present:  Unable to specify Tetanus status:  Unknown Prior injury to area:  No Relieved by:  Nothing Worsened by:  Movement Ineffective treatments:  None tried Associated symptoms: no back pain, no decreased range of motion, no fatigue, no fever, no muscle weakness, no neck pain, no numbness, no stiffness, no swelling and no tingling   Risk factors: no frequent fractures and no recent illness     Past Medical History:  Diagnosis Date  . Hypertension     There are no active problems to display for this patient.   Past Surgical History:  Procedure Laterality Date  . arm surgery  2012   left arm fracture repair        Home Medications    Prior to Admission medications   Medication Sig Start Date End Date Taking? Authorizing Provider  dicyclomine (BENTYL) 20 MG tablet Take 1 tablet (20 mg total) by mouth 2 (two) times daily. 02/17/19   Cathie HoopsYu, Amy V, PA-C  lisinopril (PRINIVIL,ZESTRIL) 10 MG tablet Take 1 tablet (10 mg total) by mouth daily. Patient not taking: Reported on 02/17/2019 02/09/19 02/09/20  Elson AreasSofia, Leslie K, PA-C  mupirocin ointment (BACTROBAN) 2 % Apply 1 application topically 2 (two) times daily. 02/17/19   Belinda FisherYu, Amy V, PA-C    Family History Family History  Problem Relation Age of Onset  . Healthy Mother   .  Healthy Father     Social History Social History   Tobacco Use  . Smoking status: Never Smoker  . Smokeless tobacco: Never Used  Substance Use Topics  . Alcohol use: Yes  . Drug use: No     Allergies   Penicillins   Review of Systems Review of Systems  Constitutional: Negative for chills, diaphoresis, fatigue and fever.  HENT: Negative for congestion.   Eyes: Negative for visual disturbance.  Respiratory: Negative for cough, chest tightness, shortness of breath and wheezing.   Cardiovascular: Negative for chest pain, palpitations and leg swelling.  Gastrointestinal: Negative for abdominal pain, constipation, diarrhea, nausea and vomiting.  Genitourinary: Negative for flank pain and frequency.  Musculoskeletal: Negative for back pain, neck pain, neck stiffness and stiffness.  Skin: Negative for rash and wound.  Neurological: Negative for light-headedness and headaches.  Psychiatric/Behavioral: Negative for agitation.     Physical Exam Updated Vital Signs BP (!) 137/103   Pulse 72   Temp 98.1 F (36.7 C) (Oral)   Resp 17   Ht 5\' 10"  (1.778 m)   Wt 81.6 kg   SpO2 98%   BMI 25.83 kg/m   Physical Exam Vitals signs and nursing note reviewed.  Constitutional:      General: He is not in acute distress.  Appearance: He is well-developed and normal weight. He is not ill-appearing, toxic-appearing or diaphoretic.  HENT:     Head: Normocephalic and atraumatic.     Nose: No congestion or rhinorrhea.  Eyes:     Conjunctiva/sclera: Conjunctivae normal.     Pupils: Pupils are equal, round, and reactive to light.  Cardiovascular:     Rate and Rhythm: Normal rate and regular rhythm.     Pulses: Normal pulses.     Heart sounds: No murmur.  Pulmonary:     Effort: Pulmonary effort is normal. No respiratory distress.     Breath sounds: Normal breath sounds.  Abdominal:     Tenderness: There is no abdominal tenderness.  Musculoskeletal: Normal range of motion.         General: Tenderness present. No swelling or deformity.     Right elbow: He exhibits normal range of motion, no swelling, no effusion, no deformity and no laceration. Tenderness found.       Arms:     Right lower leg: No edema.     Left lower leg: No edema.     Comments: Tenderness about the right elbow.  Pain with flexion and extension of the elbow.  No pain with pronation/supination.  No track marks.  No erythema, rash, or swelling seen.  Normal sensation and strength of the hand.  Good pulses.  Skin:    General: Skin is warm and dry.     Capillary Refill: Capillary refill takes less than 2 seconds.     Findings: No erythema or rash.  Neurological:     General: No focal deficit present.     Mental Status: He is alert.     Sensory: No sensory deficit.     Motor: No weakness.  Psychiatric:        Mood and Affect: Mood normal.      ED Treatments / Results  Labs (all labs ordered are listed, but only abnormal results are displayed) Labs Reviewed - No data to display  EKG None  Radiology No results found.  Procedures Procedures (including critical care time)  Medications Ordered in ED Medications - No data to display   Initial Impression / Assessment and Plan / ED Course  I have reviewed the triage vital signs and the nursing notes.  Pertinent labs & imaging results that were available during my care of the patient were reviewed by me and considered in my medical decision making (see chart for details).        Jaime Barton is a 37 y.o. R handed male with a past medical history significant for hypertension and left arm surgery who presents with right elbow pain.  He said he was of normal health last night for going to bed but woke up this morning with some right elbow pain when he bends it.  He denies known trauma or injuries.  He denies numbness, tingling, or weakness.  He denies any fevers, chills, nausea, vomiting, urinary symptoms or GI symptoms.  He reports no chest  pain, palpitations, or shortness of breath.  He denies any rashes overlying his elbow or any warmth redness or swelling.  He denies any difficulty with grip strength or other problems.  He reports the pain is a 5 out of 10 on arrival and it was slightly worse before he took ibuprofen at home.  On exam, patient has some pain with flexion and extension of his right elbow.  He has no pain with pronation and supination rotating  the radial head.  Normal grip strength.  Normal sensation.  Good pulses.  No overlying rash or erythema.  Some mild tenderness present in the elbow with no fluctuance.  No evidence of a bursitis on exam.  Shoulder nontender with full range of motion.  Lungs clear and chest nontender exam otherwise unremarkable.  Had a shared decision-making conversation with patient.  Given his lack of any infectious symptoms, overlying redness, IV drug use history, or rash, we have low suspicion that there is a septic arthritis or deep infection going on.  I have a higher suspicion that he has a musculoskeletal type pain after waking up potentially sleeping on it wrong.  No evidence of Saturday palsy given the normal strength and sensation.  Good pulses.  Is is not swollen, low suspicion for upper extremity DVT.  No tenderness in the muscle bodies in the forearm.  No weakness with extension and flexion of the wrist.  Given the isolated elbow pain with flexion extension and palpation, with no neurologic deficits, low suspicion for stroke which was the patient's primary concern initially.  Reactive arthritis also considered.  We agreed to get x-ray given the pain to look for fracture or other abnormality.  If x-ray is reassuring, anticipate placement of a sling and prescription for medication and follow-up with sports medicine as it is likely musculoskeletal in nature.  8:41 AM X-ray returned showing no acute fracture or malalignment.  There was a moderate sized elbow joint effusion.  I had a shared  decision made conversation with the patient outlining that there are different etiologies of effusions from reactive arthropathies, inflammation, gout, or even infectious etiology.  Given patient's lack of previous infections, lack of overlying symptoms, and mild to moderate pain with improvement with ibuprofen, he does not want a synovial fluid analysis and joint tap at this time.  He would rather use a sling, use pain medicine, and follow-up with sports medicine.  He reports that if the pain worsens or he starts having symptoms of infection worsening, he will return for likely joint aspiration.  This was felt to be reasonable plan and patient understands the risks of missing infection on his dominant elbow.  Patient will be given a sling and prescription for pain medication and will be discharged.  Patient had no other questions or concerns and was discharged in good condition.   Final Clinical Impressions(s) / ED Diagnoses   Final diagnoses:  Right elbow pain    ED Discharge Orders         Ordered    HYDROcodone-acetaminophen (NORCO/VICODIN) 5-325 MG tablet  Every 4 hours PRN     08/31/19 0846          Clinical Impression: 1. Right elbow pain     Disposition: Discharge  Condition: Good  I have discussed the results, Dx and Tx plan with the pt(& family if present). He/she/they expressed understanding and agree(s) with the plan. Discharge instructions discussed at great length. Strict return precautions discussed and pt &/or family have verbalized understanding of the instructions. No further questions at time of discharge.    New Prescriptions   HYDROCODONE-ACETAMINOPHEN (NORCO/VICODIN) 5-325 MG TABLET    Take 1 tablet by mouth every 4 (four) hours as needed.    Follow Up: Roby LoftsHaddix, Kevin P, MD [address] [place] [id number] [phone]     [facility] [address] [id number] mc [place] [id number]  [phone] [address]  Orvile Corona, Canary Brim, MD 08/31/19 [phone]

## 2019-08-31 NOTE — Discharge Instructions (Signed)
Your history and exam today are consistent with right elbow pain.  We discussed the option of doing a joint aspiration to rule out infection however after discussion we agreed to hold on this and instead treat with rest, ice, compression, immobilization, and elevation.  We will give you prescription for pain medicine.  Please follow-up with the sports medicine team if your symptoms persist and if symptoms worsen, please return to the nearest emergency department as you may need joint aspiration.

## 2019-08-31 NOTE — ED Notes (Signed)
Splint applied and instructions given.

## 2019-08-31 NOTE — ED Notes (Signed)
Patient states he took bp medicine before coming but hadn't taken it in a few days

## 2019-08-31 NOTE — ED Triage Notes (Addendum)
Patient awoke from sleep with right arm pain.  Patient denies any injury, no injury to the arm.  He took some ibuprofen without any relief.  Patient states that his right elbow joint and anterior right forearm.

## 2019-09-03 IMAGING — DX DG ELBOW COMPLETE 3+V*R*
4 series · 4 of 4 positions shown · non-contrast
Comparison: None.

CLINICAL DATA: Elbow pain

EXAM:
RIGHT ELBOW - COMPLETE 3+ VIEW

[elbow ap]
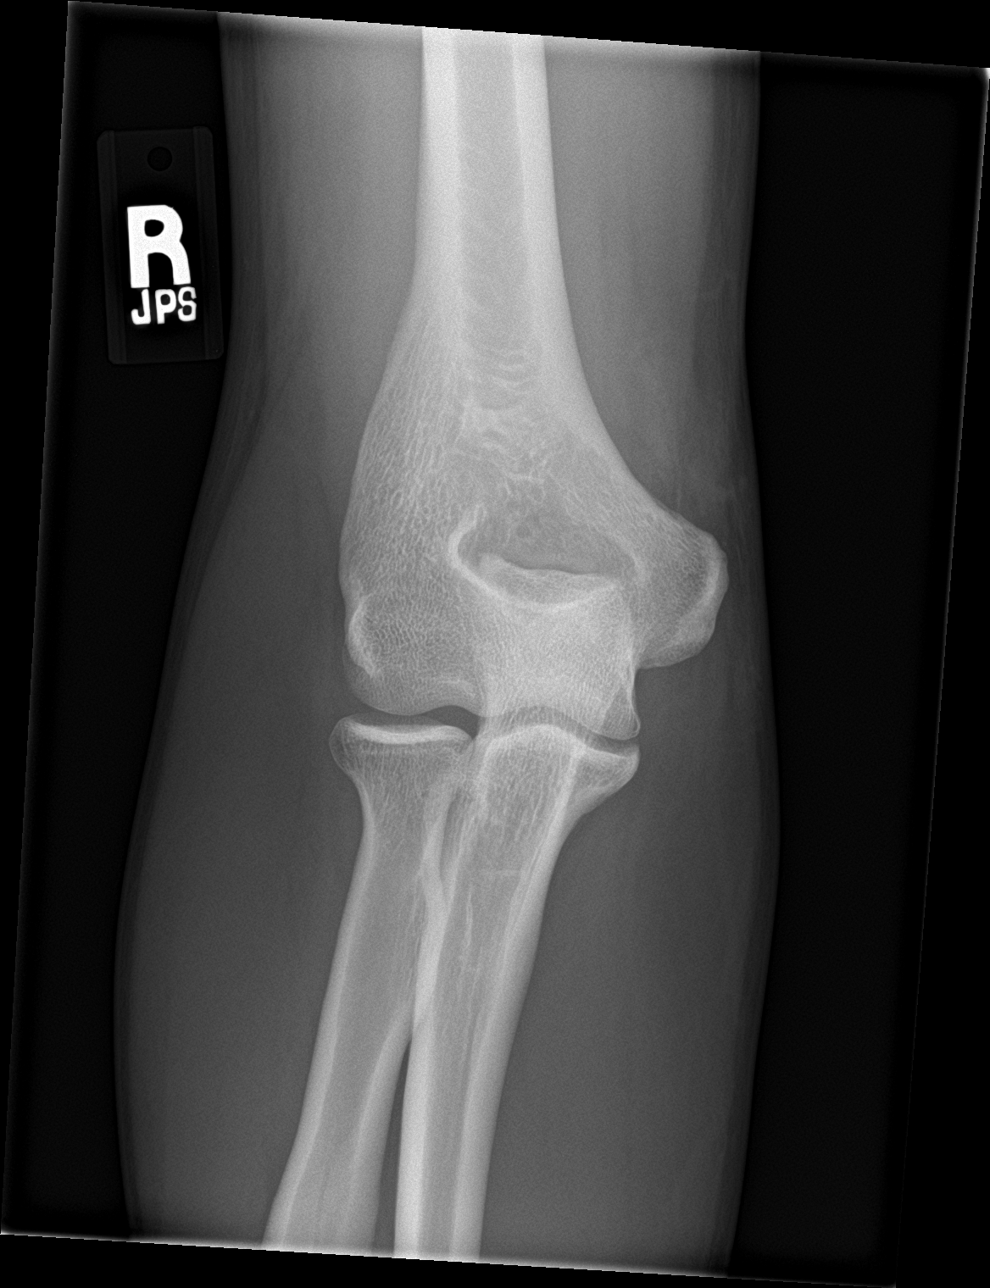

[elbow obl (1 of 2)]
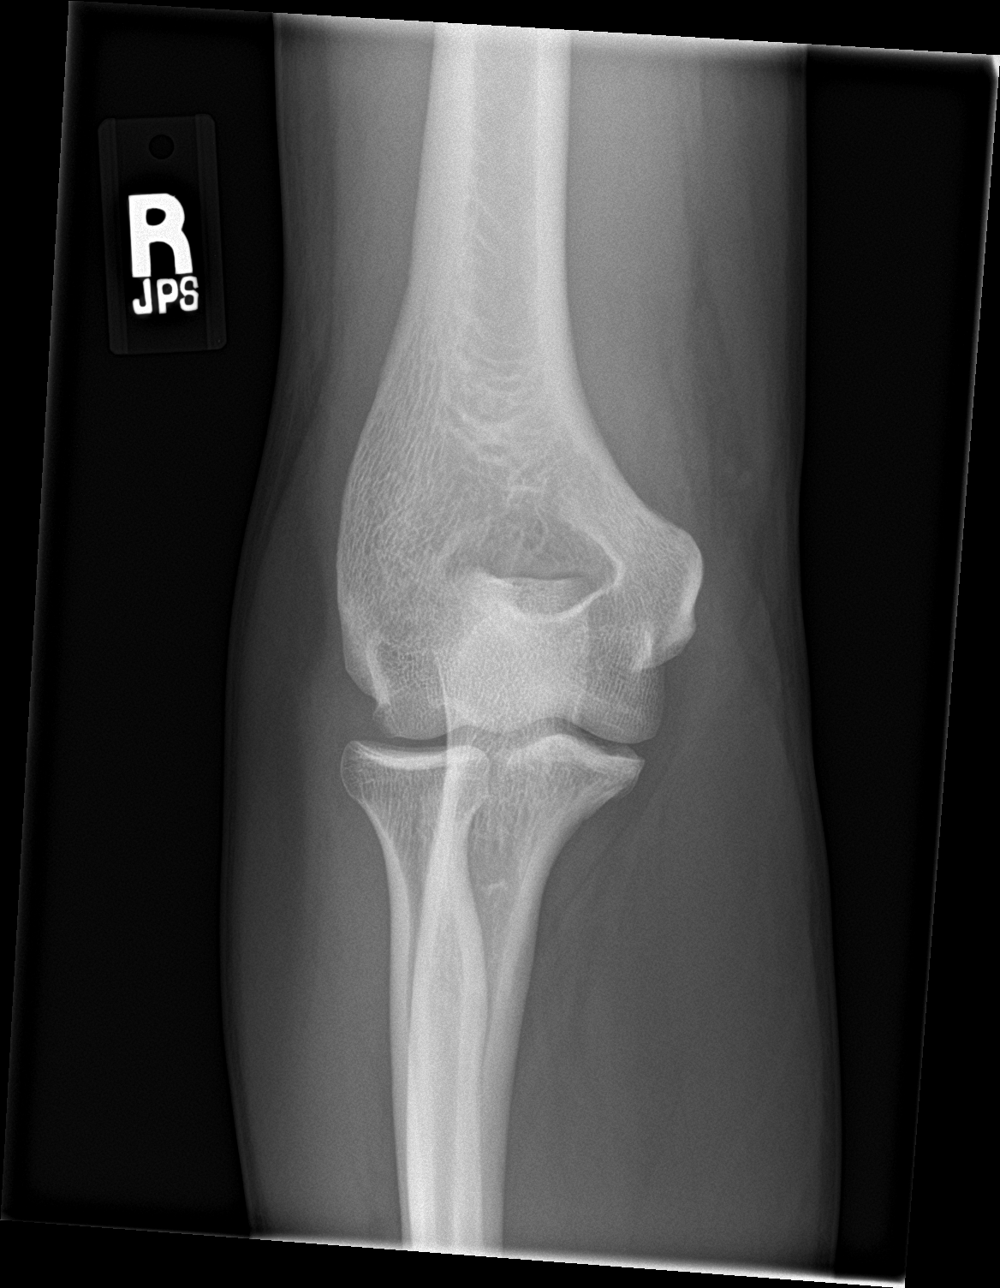

[elbow obl (2 of 2)]
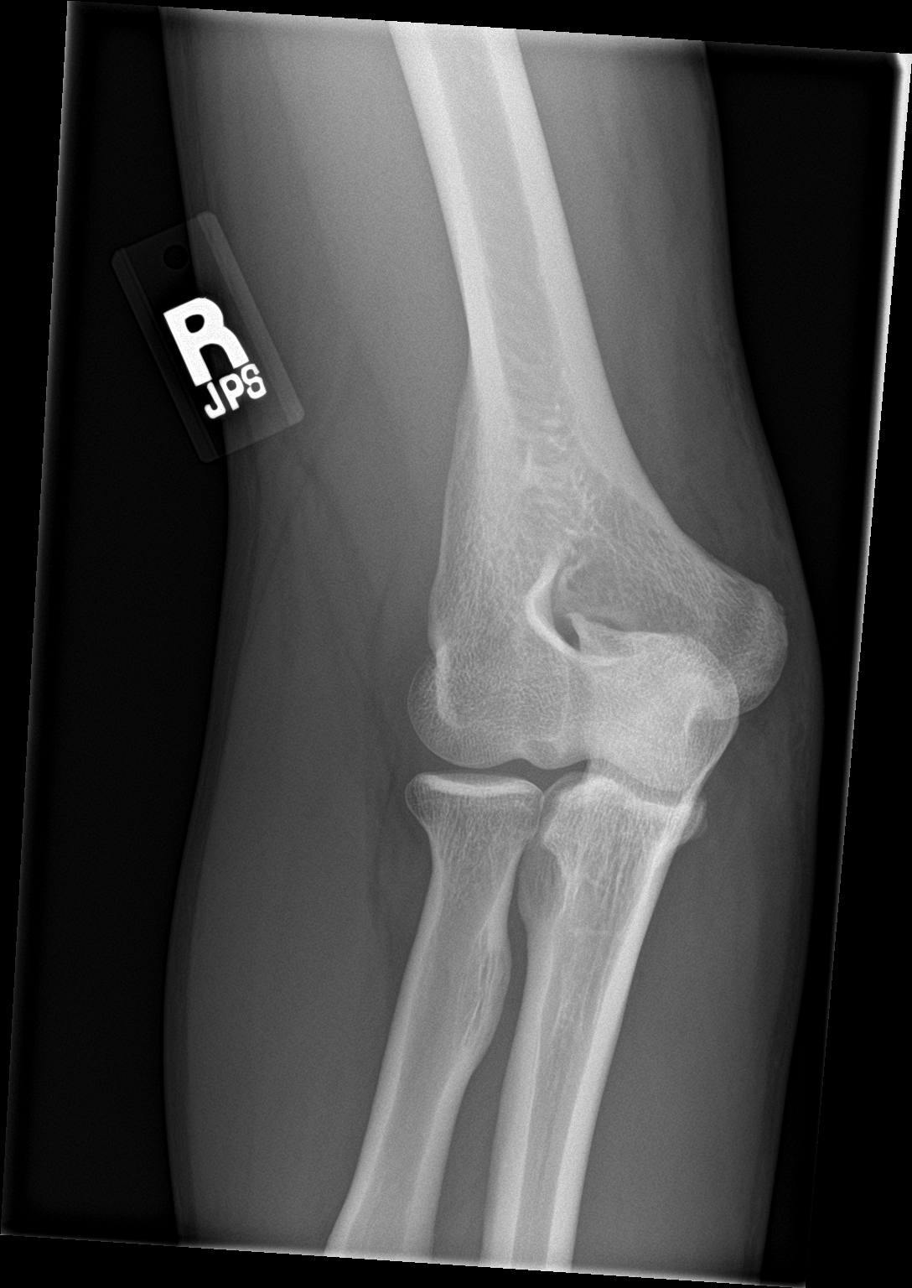

[elbow lat]
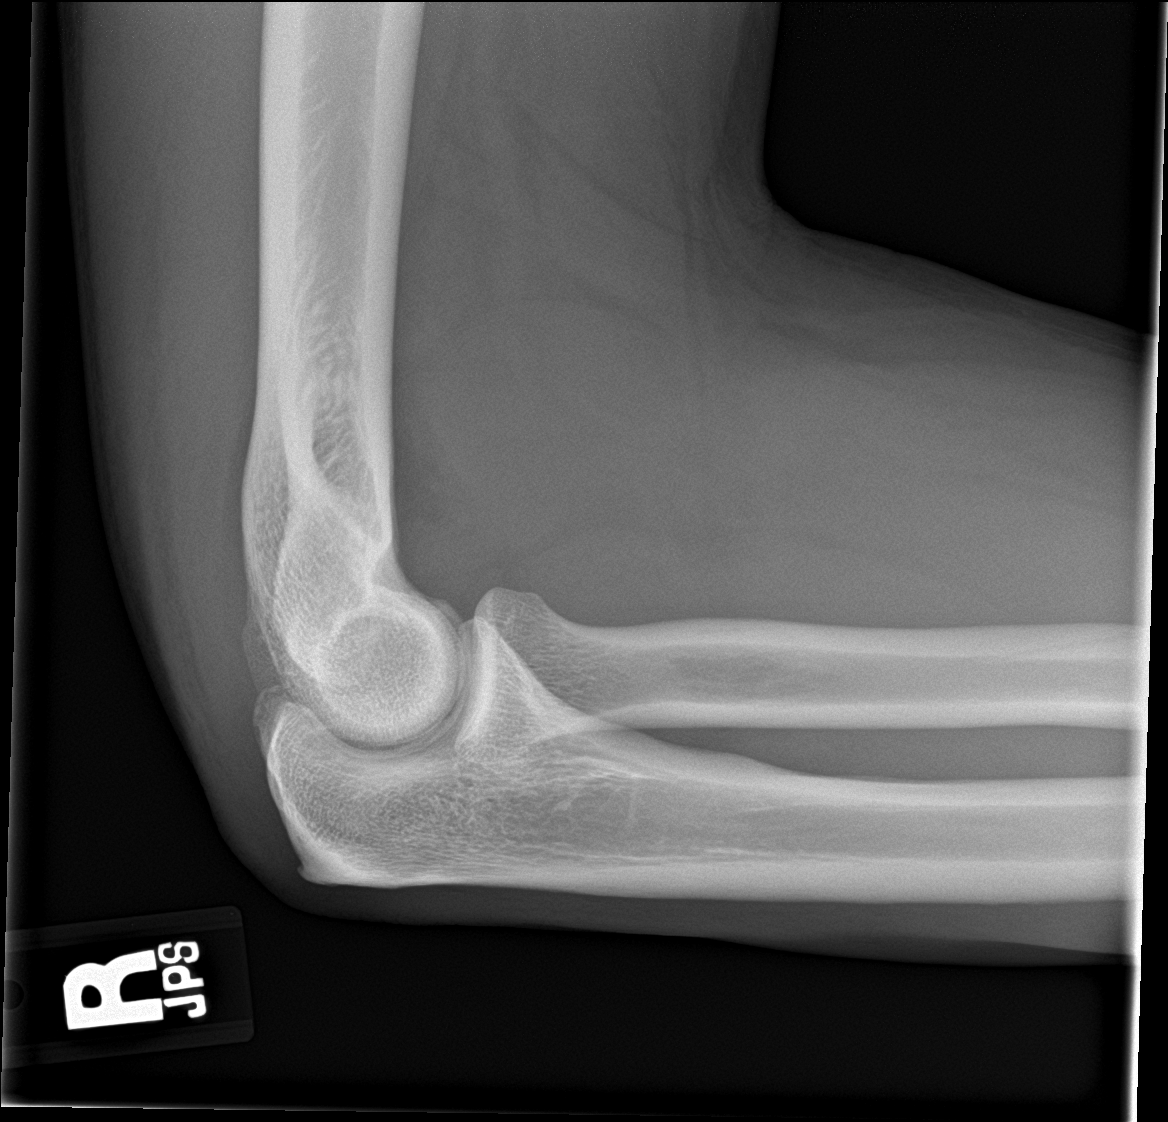

[4 of 4 positions shown; findings below may reference images not displayed]

FINDINGS: There is no evidence of fracture or malalignment. Tiny triceps
tendon enthesophyte. There is no evidence of arthropathy or other
focal bone abnormality. There is a moderate-sized elbow joint
effusion. Soft tissues are unremarkable.
IMPRESSION: 1. No acute fracture or malalignment.
2. There is a moderate-sized elbow joint effusion. In the absence of
trauma, this is a nonspecific finding and can be seen in the setting
of infection, inflammation, or other arthropathies.

## 2020-06-13 ENCOUNTER — Ambulatory Visit: Payer: Self-pay | Admitting: Cardiology

## 2020-06-20 ENCOUNTER — Ambulatory Visit: Payer: Self-pay | Admitting: Cardiology

## 2022-03-14 ENCOUNTER — Ambulatory Visit: Payer: BLUE CROSS/BLUE SHIELD | Admitting: Orthopaedic Surgery

## 2022-03-19 ENCOUNTER — Ambulatory Visit: Payer: BLUE CROSS/BLUE SHIELD | Admitting: Orthopaedic Surgery

## 2022-03-29 ENCOUNTER — Ambulatory Visit: Payer: 59 | Admitting: Orthopaedic Surgery

## 2022-03-29 DIAGNOSIS — M5441 Lumbago with sciatica, right side: Secondary | ICD-10-CM

## 2022-03-29 MED ORDER — PREDNISONE 10 MG (21) PO TBPK: ORAL_TABLET | ORAL | 3 refills | Status: DC

## 2022-03-29 MED ORDER — GABAPENTIN 100 MG PO CAPS: 300.0000 mg | ORAL_CAPSULE | Freq: Every day | ORAL | 3 refills | Status: DC

## 2022-03-29 MED ORDER — DICLOFENAC SODIUM 75 MG PO TBEC: 75.0000 mg | DELAYED_RELEASE_TABLET | Freq: Two times a day (BID) | ORAL | 2 refills | Status: DC

## 2022-03-29 NOTE — Progress Notes (Signed)
? ?Office Visit Note ?  ?Patient: Jaime Barton           ?Date of Birth: May 20, 1981           ?MRN: [id number] ?Visit Date: 03/29/2022 ?             ?Requested by: Ralene Ok, MD ?[address] ?[place],  [place] [address] ?PCP: Ralene Ok, MD ? ? ?Assessment & Plan: ?Visit Diagnoses:  ?1. Acute right-sided low back pain with right-sided sciatica   ? ? 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  We will make a referral to outpatient PT.  Prescription for prednisone gabapentin and diclofenac.  Follow-up if no improvement. ? ?Follow-Up Instructions: No follow-ups on file.  ? ?Orders:  ?Orders Placed This Encounter  ?Procedures  ? Ambulatory referral to Physical Therapy  ? ?Meds ordered this encounter  ?Medications  ? predniSONE (STERAPRED UNI-PAK 21 TAB) 10 MG (21) TBPK tablet  ?  Sig: Take as directed  ?  Dispense:  21 tablet  ?  Refill:  3  ? gabapentin (NEURONTIN) 100 MG capsule  ?  Sig: Take 3 capsules (300 mg total) by mouth at bedtime.  ?  Dispense:  30 capsule  ?  Refill:  3  ? diclofenac (VOLTAREN) 75 MG EC tablet  ?  Sig: Take 1 tablet (75 mg total) by mouth 2 (two) times daily.  ?  Dispense:  30 tablet  ?  Refill:  2  ? ? ? ? Procedures: ?No procedures performed ? ? ?Clinical Data: ?No additional findings. ? ? ?Subjective: ?Chief Complaint  ?Patient presents with  ? Lower Back - Pain  ? Right Leg - Pain  ? ? ?HPI ? ?[name] is a 41 year old gentleman here for evaluation of right posterior thigh and buttock pain with radiation to the posterior right knee.  Denies any numbness tingling or groin pain.  His PCP placed him on 2 weeks of prednisone which helped some.  Denies any red flag symptoms. ? ?Review of Systems  ?Constitutional: Negative.   ?All other systems reviewed and are negative. ? ? ?Objective: ?Vital Signs: There were no vitals taken for this visit. ? ?Physical Exam ?Vitals and nursing note reviewed.  ?Constitutional:   ?   Appearance: He is well-developed.   ?HENT:  ?   Head: Normocephalic and atraumatic.  ?Eyes:  ?   Pupils: Pupils are equal, round, and reactive to light.  ?Pulmonary:  ?   Effort: Pulmonary effort is normal.  ?Abdominal:  ?   Palpations: Abdomen is soft.  ?Musculoskeletal:     ?   General: Normal range of motion.  ?   Cervical back: Neck supple.  ?Skin: ?   General: Skin is warm.  ?Neurological:  ?   Mental Status: He is alert and oriented to person, place, and time.  ?Psychiatric:     ?   Behavior: Behavior normal.     ?   Thought Content: Thought content normal.     ?   Judgment: Judgment normal.  ? ? ?Ortho Exam ? ?Examination of the right lower extremity shows excellent range of motion of the hip.  Knee exam is unremarkable.  Positive sciatic tension signs.  Lateral hip is nontender.  Negative Faber. ? ?Specialty Comments:  ?No specialty comments available. ? ?Imaging: ?No results found. ? ? ?PMFS History: ?Patient Active Problem List  ? Diagnosis Date Noted  ? Acute right-sided low back pain with right-sided sciatica 03/29/2022  ? ?  Past Medical History:  ?Diagnosis Date  ? Hypertension   ?  ?Family History  ?Problem Relation Age of Onset  ? Healthy Mother   ? Healthy Father   ? Healthy Sister   ? Healthy Brother   ? Healthy Sister   ?  ?Past Surgical History:  ?Procedure Laterality Date  ? arm surgery  2012  ? left arm fracture repair  ? ?Social History  ? ?Occupational History  ? Not on file  ?Tobacco Use  ? Smoking status: Never  ? Smokeless tobacco: Never  ?Vaping Use  ? Vaping Use: Never used  ?Substance and Sexual Activity  ? Alcohol use: Not Currently  ? Drug use: No  ? Sexual activity: Not on file  ? ? ? ? ? ? ?

## 2022-05-14 ENCOUNTER — Telehealth: Payer: Self-pay | Admitting: Orthopaedic Surgery

## 2022-05-14 DIAGNOSIS — M5441 Lumbago with sciatica, right side: Secondary | ICD-10-CM

## 2022-05-14 NOTE — Telephone Encounter (Signed)
yes

## 2022-05-14 NOTE — Telephone Encounter (Signed)
Is this ok?

## 2022-05-14 NOTE — Telephone Encounter (Signed)
Patient called. He would like to be referred to a pain clinic. His call back number is [phone] ?

## 2022-05-14 NOTE — Telephone Encounter (Signed)
Called and advised pt. He stated understanding  

## 2022-05-14 NOTE — Telephone Encounter (Signed)
Referral placed in chart  

## 2022-05-30 ENCOUNTER — Encounter: Payer: Self-pay | Admitting: Orthopaedic Surgery

## 2022-06-18 ENCOUNTER — Telehealth: Payer: Self-pay | Admitting: Orthopaedic Surgery

## 2022-06-18 ENCOUNTER — Other Ambulatory Visit: Payer: Self-pay | Admitting: Physician Assistant

## 2022-06-18 ENCOUNTER — Other Ambulatory Visit: Payer: Self-pay

## 2022-06-18 DIAGNOSIS — M5441 Lumbago with sciatica, right side: Secondary | ICD-10-CM

## 2022-06-18 MED ORDER — METHOCARBAMOL 750 MG PO TABS: 750.0000 mg | ORAL_TABLET | Freq: Three times a day (TID) | ORAL | 2 refills | Status: DC | PRN

## 2022-06-18 MED ORDER — TRAMADOL HCL 50 MG PO TABS: 50.0000 mg | ORAL_TABLET | Freq: Three times a day (TID) | ORAL | 2 refills | Status: DC | PRN

## 2022-06-18 NOTE — Telephone Encounter (Signed)
Tried calling patient. No answer. No voicemail

## 2022-06-18 NOTE — Telephone Encounter (Signed)
I sent in tramadol and robaxin.  Also, if pain is so bad, we need to get mri lumbar spine.  Has he been referred to pain management?

## 2022-06-18 NOTE — Telephone Encounter (Signed)
Patient's wife  Pryor Montes called asked if something stronger can be called in for pain for the patient? Pryor Montes said the Prednisone is not working. The number to contact patient is   [phone]

## 2022-06-19 NOTE — Telephone Encounter (Signed)
Ok, thanks.

## 2022-06-27 ENCOUNTER — Telehealth: Payer: Self-pay | Admitting: Orthopaedic Surgery

## 2022-06-27 NOTE — Telephone Encounter (Signed)
Called pt 1X and was unable to leave vm. Just rang. Pt need MRI Review appt with Dr Roda Shutters after 07/03/22

## 2022-06-29 ENCOUNTER — Ambulatory Visit (INDEPENDENT_AMBULATORY_CARE_PROVIDER_SITE_OTHER): Payer: 59

## 2022-06-29 DIAGNOSIS — M5441 Lumbago with sciatica, right side: Secondary | ICD-10-CM | POA: Diagnosis not present

## 2022-07-03 ENCOUNTER — Ambulatory Visit (HOSPITAL_COMMUNITY): Payer: 59

## 2022-07-05 ENCOUNTER — Encounter: Payer: Self-pay | Admitting: Physical Medicine & Rehabilitation

## 2022-07-05 ENCOUNTER — Encounter: Payer: 59 | Attending: Physical Medicine & Rehabilitation | Admitting: Physical Medicine & Rehabilitation

## 2022-07-05 ENCOUNTER — Encounter: Payer: Self-pay | Admitting: Orthopaedic Surgery

## 2022-07-05 ENCOUNTER — Ambulatory Visit: Payer: 59 | Admitting: Orthopaedic Surgery

## 2022-07-05 VITALS — BP 171/114 | HR 67 | Ht 70.0 in | Wt 178.8 lb

## 2022-07-05 DIAGNOSIS — M5416 Radiculopathy, lumbar region: Secondary | ICD-10-CM

## 2022-07-05 DIAGNOSIS — M5136 Other intervertebral disc degeneration, lumbar region: Secondary | ICD-10-CM | POA: Diagnosis present

## 2022-07-05 DIAGNOSIS — M48061 Spinal stenosis, lumbar region without neurogenic claudication: Secondary | ICD-10-CM | POA: Diagnosis present

## 2022-07-05 DIAGNOSIS — M5417 Radiculopathy, lumbosacral region: Secondary | ICD-10-CM | POA: Insufficient documentation

## 2022-07-05 MED ORDER — HYDROCODONE-ACETAMINOPHEN 5-325 MG PO TABS: 1.0000 | ORAL_TABLET | Freq: Two times a day (BID) | ORAL | 0 refills | Status: DC | PRN

## 2022-07-05 NOTE — Progress Notes (Unsigned)
Subjective:    Patient ID: Jaime Barton, male    DOB: 28-Nov-1981, 41 y.o.   MRN: [id number]  HPI  41 year old male with past medical history of hypertension who is here for lower back pain.  Patient reports he developed back pain in January.  Pain has been shooting down his right lower extremity to the posterior part of his ankle and foot.  No sensory or motor changes.  No bowel or bladder changes.  He was prescribed 2 weeks of prednisone initially with mild improvement.  He has tried tramadol, muscle relaxers, gabapentin and Voltaren without significant improvement.  Tylenol and ibuprofen have provided some benefit.  He was seen by orthopedics.  On MRI he is found to have a disc protrusion L5-S1 displacing the right S1 nerve root.  He had a visit with orthopedics today and was prescribed Norco 5 20 tablets.  He is also being scheduled for a ESI with Dr. Alvester Morin.   Pain Inventory Average Pain 8 Pain Right Now 6 My pain is sharp, burning, stabbing, tingling, and aching  In the last 24 hours, has pain interfered with the following? General activity 6 Relation with others 7 Enjoyment of life 7 What TIME of day is your pain at its worst? daytime and evening Sleep (in general) Fair  Pain is worse with: walking, bending, sitting, standing, and some activites Pain improves with: rest and medication Relief from Meds: 5  how many minutes can you walk? 20 ability to climb steps?  yes do you drive?  no  employed # of hrs/week 48  tingling trouble walking  New pt  New pt    Family History  Problem Relation Age of Onset   Healthy Mother    Healthy Father    Healthy Sister    Healthy Brother    Healthy Sister    Social History   Socioeconomic History   Marital status: Single    Spouse name: Not on file   Number of children: Not on file   Years of education: Not on file   Highest education level: Not on file  Occupational History   Not on file  Tobacco Use   Smoking  status: Never   Smokeless tobacco: Never  Vaping Use   Vaping Use: Never used  Substance and Sexual Activity   Alcohol use: Not Currently   Drug use: No   Sexual activity: Not on file  Other Topics Concern   Not on file  Social History Narrative   Not on file   Social Determinants of Health   Financial Resource Strain: Not on file  Food Insecurity: Not on file  Transportation Needs: Not on file  Physical Activity: Not on file  Stress: Not on file  Social Connections: Not on file   Past Surgical History:  Procedure Laterality Date   arm surgery  2012   left arm fracture repair   Past Medical History:  Diagnosis Date   Hypertension    BP (!) 171/114   Pulse 67   Ht 5\' 10"  (1.778 m)   Wt 178 lb 12.8 oz (81.1 kg)   SpO2 97%   BMI 25.66 kg/m   Opioid Risk Score:   Fall Risk Score:  `1  Depression screen PHQ 2/9      No data to display            Review of Systems  Musculoskeletal:  Positive for back pain and gait problem.  Pain starts in low back and radiates down the back of right leg to foot  All other systems reviewed and are negative.     Objective:   Physical Exam  Gen: no distress, normal appearing HEENT: oral mucosa pink and moist, NCAT Cardio: Reg rate Chest: normal effort, normal rate of breathing Abd: soft, non-distended Ext: no edema Psych: pleasant, normal affect Skin: intact Neuro: Alert and oriented, Follows commands, sensation intact to LT in all 4 extremities, strength 5/5 in all 4 extremities  Musculoskeletal: No joint swelling noted SLR pos on R Faber and FAIR neg b/l Facet loading negative  L spine X MRI 06/29/22   Segmentation: Standard. Lowest well-formed disc space labeled the L5-S1 level.   Alignment: 3 mm retrolisthesis of L5 on S1. Straightening with mild reversal of the normal lumbar lordosis.   Vertebrae: Vertebral body height maintained without acute or chronic fracture. Bone marrow signal intensity within  normal limits. No discrete or worrisome osseous lesions. Discogenic reactive endplate change present about the L5-S1 interspace. No other abnormal marrow edema.   Conus medullaris and cauda equina: Conus extends to the L1 level. Conus and cauda equina appear normal.   Paraspinal and other soft tissues: Unremarkable.   Disc levels:   No significant findings are seen through the L4-5 level.   L5-S1: Retrolisthesis. Diffuse disc bulge with disc desiccation and reactive endplate spurring. Superimposed moderate-sized right subarticular disc protrusion with slight inferior angulation. Protruding disc contacts and displaces the descending right S1 nerve root in the right lateral recess. Moderate right lateral recess stenosis. Central canal remains patent. Mild to moderate bilateral L5 foraminal stenosis at this level.   IMPRESSION: 1. Moderate-sized right subarticular disc protrusion at L5-S1, contacting and displacing the descending right S1 nerve root. 2. Mild to moderate bilateral L5 foraminal stenosis related to disc bulge and reactive endplate change.   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  He has a disc protrusion L5-S1 contacting and displacing the right S1 nerve root.  He also has bilateral L5 foraminal stenosis on MRI. -Patient saw orthopedics today who is scheduling an ESI -Orthopedics is ordered hydrocodone 5mg   20 tabs, this seems like a reasonable step for his acute pain -Continue Tylenol and ibuprofen as needed, he is not taking excessive amounts of these medications -We will order physical therapy

## 2022-07-05 NOTE — Progress Notes (Unsigned)
Office Visit Note   Patient: Jaime Barton           Date of Birth: 15-Aug-1981           MRN: [id number] Visit Date: 07/05/2022              Requested by: Ralene Ok, MD [address] [place],  [place] [address] PCP: Ralene Ok, MD   Assessment & Plan: Visit Diagnoses:  1. Radiculopathy, lumbar region     Plan: Impression is chronic right lower extremity radiculopathy with recent MRI findings suggestive of disc protrusion L5-S1 contacting and displacing the descending right S1 nerve root in addition to mild to moderate bilateral L5 foraminal stenosis.  At this point, the patient has not had relief from various prescription medications and I would like to go ahead make referral to Dr. Alvester Morin for [facility].  He is scheduled to start physical therapy today and I think it is okay to continue with this plan as well.  Follow-up as needed.  Follow-Up Instructions: No follow-ups on file.   Orders:  Orders Placed This Encounter  Procedures   Ambulatory referral to Physical Medicine Rehab   Meds ordered this encounter  Medications   HYDROcodone-acetaminophen (NORCO) 5-325 MG tablet    Sig: Take 1 tablet by mouth 2 (two) times daily as needed.    Dispense:  20 tablet    Refill:  0      Procedures: No procedures performed   Clinical Data: No additional findings.   Subjective: Chief Complaint  Patient presents with   Lower Back - Pain    HPI patient is a pleasant 41 year old gentleman who comes in today to discuss MRI results of the lumbar spine.  He is dealing with chronic right buttock pain for many months which is now rating down the entire right leg and into the foot.  Symptoms appear to be worse with sitting on hard surface as well as when he is moving or standing for a long time.  He is complaining of paresthesias to the right lower extremity.  He has tried steroids, tramadol, gabapentin, NSAIDs and muscle relaxers all without relief.  No previous ESI.  Recent MRI of the left  lumbar spine does show a right disc protrusion L5-S1 contacting displacing the descending right S1 nerve root in addition to mild to moderate bilateral L5 foraminal stenosis with disc bulge.     Objective: Vital Signs: There were no vitals taken for this visit.    Ortho Exam unchanged lumbar spine exam  Specialty Comments:  No specialty comments available.  Imaging: No new imaging   PMFS History: Patient Active Problem List   Diagnosis Date Noted   Acute right-sided low back pain with right-sided sciatica 03/29/2022   Past Medical History:  Diagnosis Date   Hypertension     Family History  Problem Relation Age of Onset   Healthy Mother    Healthy Father    Healthy Sister    Healthy Brother    Healthy Sister     Past Surgical History:  Procedure Laterality Date   arm surgery  2012   left arm fracture repair   Social History   Occupational History   Not on file  Tobacco Use   Smoking status: Never   Smokeless tobacco: Never  Vaping Use   Vaping Use: Never used  Substance and Sexual Activity   Alcohol use: Not Currently   Drug use: No   Sexual activity: Not on file

## 2022-07-16 NOTE — Therapy (Signed)
OUTPATIENT PHYSICAL THERAPY THORACOLUMBAR EVALUATION   Patient Name: Jaime Barton MRN: [id number] DOB:1981/01/07, 41 y.o., male Today's Date: 07/17/2022   PT End of Session - 07/17/22 1824     Visit Number 1    Number of Visits 17    Date for PT Re-Evaluation 09/11/22    Authorization Type [employer]    PT Start Time 1825    PT Stop Time 1900    PT Time Calculation (min) 35 min    Activity Tolerance Patient tolerated treatment well    Behavior During Therapy [facility] for tasks assessed/performed             Past Medical History:  Diagnosis Date   Hypertension    Past Surgical History:  Procedure Laterality Date   arm surgery  2012   left arm fracture repair   Patient Active Problem List   Diagnosis Date Noted   Acute right-sided low back pain with right-sided sciatica 03/29/2022    PCP: Ralene Ok, MD  REFERRING PROVIDER: Fanny Dance, MD  REFERRING DIAG: [id number] (ICD-10-CM) - Lumbosacral radiculopathy at S1  Rationale for Evaluation and Treatment Rehabilitation  THERAPY DIAG:  Other low back pain - Plan: PT plan of care cert/re-cert  Muscle weakness (generalized) - Plan: PT plan of care cert/re-cert  ONSET DATE: Chronic  SUBJECTIVE:                                                                                                                                                                                           SUBJECTIVE STATEMENT: Pt presents to PT with reports of chronic lower back and R LE pain and discomfort. Recently started a new job that involves a lot of standing which exacerbates pain. No MOI, pain has been gradually increasing. Denies bowel/bladder changes or saddle anesthesia. Also denies N/T down R LE.   PERTINENT HISTORY:  HTN  PAIN:  Are you having pain?  Yes: NPRS scale: 7/10 (10/10 at worst) Pain location: R lower back, R gluteal, R LE to foot Aggravating factors: prolonged standing, walking Relieving factors: some  exercise   PRECAUTIONS: None  WEIGHT BEARING RESTRICTIONS No  FALLS:  Has patient fallen in last 6 months? 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   EXAM: MRI LUMBAR SPINE WITHOUT CONTRAST   TECHNIQUE: Multiplanar, multisequence MR imaging of the  lumbar spine was performed. No intravenous contrast was administered.   COMPARISON:  None available.   FINDINGS: Segmentation: Standard. Lowest well-formed disc space labeled the L5-S1 level.   Alignment: 3 mm retrolisthesis of L5 on S1. Straightening with mild reversal of the normal lumbar lordosis.   Vertebrae: Vertebral body height maintained without acute or chronic fracture. Bone marrow signal intensity within normal limits. No discrete or worrisome osseous lesions. Discogenic reactive endplate change present about the L5-S1 interspace. No other abnormal marrow edema.   Conus medullaris and cauda equina: Conus extends to the L1 level. Conus and cauda equina appear normal.   Paraspinal and other soft tissues: Unremarkable.   Disc levels:   No significant findings are seen through the L4-5 level.   L5-S1: Retrolisthesis. Diffuse disc bulge with disc desiccation and reactive endplate spurring. Superimposed moderate-sized right subarticular disc protrusion with slight inferior angulation. Protruding disc contacts and displaces the descending right S1 nerve root in the right lateral recess. Moderate right lateral recess stenosis. Central canal remains patent. Mild to moderate bilateral L5 foraminal stenosis at this level.   IMPRESSION: 1. Moderate-sized right subarticular disc protrusion at L5-S1, contacting and  displacing the descending right S1 nerve root. 2. Mild to moderate bilateral L5 foraminal stenosis related to disc bulge and reactive endplate change.  PATIENT SURVEYS:   FOTO: 47% function; 60% predicted  COGNITION:  Overall cognitive status: Within functional limits for tasks assessed     SENSATION: WFL  POSTURE:   Medium body habitus; slumped sitting posture, decreased lumbar lordosis  PALPATION: TTP to R lumbar paraspinals, R gluteals  LUMBAR ROM:   Active  A/PROM  eval  Flexion WFL p!  Extension WFL p!  Right lateral flexion WFL  Left lateral flexion WFL  Right rotation WFL  Left rotation WFL   (Blank rows = not tested)  LOWER EXTREMITY MMT:    MMT Right 07/17/2022  Left 07/17/2022   Hip flexion     Hip extension    Hip abduction    Hip adduction    Hip external rotation    Hip internal rotation    Knee extension    Knee flexion    Ankle dorsiflexion     Ankle plantarflexion    Ankle inversion    Ankle eversion    Grossly WNL WNL  (Blank rows = not tested)  LUMBAR SPECIAL TESTS:  Straight leg raise test: Positive and Slump test: Positive  FUNCTIONAL TESTS:  30 Second Sit to Stand: 16 reps POE: No change in peripheral symptoms  GAIT: Distance walked: 24ft Assistive device utilized: None Level of assistance: Complete Independence Comments: slight antalgic gait R  TODAY'S TREATMENT  [facility] Adult PT Treatment:                                                DATE: 07/17/2022 Therapeutic Exercise: Seated sciatic nerve glide x 10 R Supine PPT x 5 - 5" hold Bridge x 5  Supine piriformis stretch x 30" R Supine hamstring stretch with strap x 30" R Seated fig 4 stretch x 30" R  PATIENT EDUCATION:  Education details: eval findings, FOTO, HEP, POC Person educated: Patient Education method: Explanation, Demonstration, and Handouts Education comprehension: verbalized understanding and returned demonstration   HOME EXERCISE PROGRAM: Access Code:  [email or web address] URL: [email or web address] Date: 07/17/2022 Prepared by: Edwinna Areola  Exercises -  Seated Sciatic Tensioner  - 3 x daily - 7 x weekly - 3 sets - 10 reps - Supine Posterior Pelvic Tilt  - 1 x daily - 7 x weekly - 2 sets - 10 reps - 5 sec hold - Supine Bridge  - 1 x daily - 7 x weekly - 3 sets - 10 reps - 3 sec hold - Hooklying Hamstring Stretch with Strap  - 1 x daily - 7 x weekly - 2-3 reps - 30 sec hold - Supine Piriformis Stretch with Leg Straight  - 1 x daily - 7 x weekly - 2-3 reps - 30 sec hold - Seated Figure 4 Piriformis Stretch  - 1 x daily - 7 x weekly - 2-3 reps - 30 sec hold  ASSESSMENT:  CLINICAL IMPRESSION: Patient is a 41 y.o. M who was seen today for physical therapy evaluation and treatment for chronic LBP with referral into R LE. Physical findings are consistent with MD impression, as pt has positive slump and SLR test items, indicating adverse neural tension. His FOTO score shows reduced functional ability below PLOF indicating he would benefit from skilled PT services working on improving core endurance and decreasing pain. Will assess response to initial interventions and progress as able.     OBJECTIVE IMPAIRMENTS decreased activity tolerance, decreased mobility, decreased strength, and pain   ACTIVITY LIMITATIONS carrying, lifting, bending, standing, squatting, stairs, and transfers  PARTICIPATION LIMITATIONS: community activity, occupation, and yard work  PERSONAL FACTORS Time since onset of injury/illness/exacerbation and 1 comorbidity: HTN  are also affecting patient's functional outcome.   REHAB POTENTIAL: Excellent  CLINICAL DECISION MAKING: Stable/uncomplicated  EVALUATION COMPLEXITY: Low   GOALS: Goals reviewed with patient? No  SHORT TERM GOALS: Target date: 08/07/2022  Pt will be compliant and knowledgeable with initial HEP for improved comfort and carryover Baseline: initial HEP given  Goal status: INITIAL  2.  Pt will  self report lower back and R LE pain no greater than 6/10 for improved comfort and functional ability Baseline: 10/10 at worst Goal status: INITIAL  LONG TERM GOALS: Target date: 09/11/2022  Pt will improve FOTO function score to no less than 60% as proxy for functional improvement Baseline: 47% function Goal status: INITIAL  2.  Pt will self report lower back and R LE pain no greater than 1-2/10 for improved comfort and functional ability Baseline: 10/10 at worst Goal status: INITIAL  3.  Pt will self report improved standing tolerance to >2 hours without increase in pain at work for improved comfort and function Baseline: <1hr Goal status: INITIAL  PLAN: PT FREQUENCY: 1-2x/week  PT DURATION: 8 weeks  PLANNED INTERVENTIONS: Therapeutic exercises, Therapeutic activity, Neuromuscular re-education, Balance training, Gait training, Patient/Family education, Self Care, Joint mobilization, Dry Needling, Electrical stimulation, Cryotherapy, Moist heat, Vasopneumatic device, Manual therapy, and Re-evaluation.  PLAN FOR NEXT SESSION: assess HEP response, progress neutral spine core strength, TPDN to lumbar paraspinals and R gluteals   Eloy End, PT 07/17/2022, 7:08 PM

## 2022-07-17 ENCOUNTER — Ambulatory Visit: Payer: 59 | Attending: Physical Medicine & Rehabilitation

## 2022-07-17 DIAGNOSIS — M6281 Muscle weakness (generalized): Secondary | ICD-10-CM | POA: Insufficient documentation

## 2022-07-17 DIAGNOSIS — M5459 Other low back pain: Secondary | ICD-10-CM | POA: Insufficient documentation

## 2022-07-17 DIAGNOSIS — M5417 Radiculopathy, lumbosacral region: Secondary | ICD-10-CM | POA: Diagnosis not present

## 2022-07-25 ENCOUNTER — Ambulatory Visit: Payer: 59

## 2022-07-25 DIAGNOSIS — M5459 Other low back pain: Secondary | ICD-10-CM | POA: Diagnosis not present

## 2022-07-25 DIAGNOSIS — M6281 Muscle weakness (generalized): Secondary | ICD-10-CM

## 2022-07-25 NOTE — Patient Instructions (Signed)

## 2022-07-25 NOTE — Therapy (Signed)
OUTPATIENT PHYSICAL THERAPY TREATMENT NOTE   Patient Name: Jaime Barton MRN: [id number] DOB:May 10, 1981, 41 y.o., male Today's Date: 07/25/2022  PCP: Ralene Ok, MD REFERRING PROVIDER: Fanny Dance, MD  END OF SESSION:   PT End of Session - 07/25/22 1749     Visit Number 2    Number of Visits 17    Date for PT Re-Evaluation 09/11/22    Authorization Type [employer]    PT Start Time 1747    PT Stop Time 1827    PT Time Calculation (min) 40 min    Activity Tolerance Patient tolerated treatment well    Behavior During Therapy [facility] for tasks assessed/performed             Past Medical History:  Diagnosis Date   Hypertension    Past Surgical History:  Procedure Laterality Date   arm surgery  2012   left arm fracture repair   Patient Active Problem List   Diagnosis Date Noted   Acute right-sided low back pain with right-sided sciatica 03/29/2022    REFERRING DIAG: M54.17 (ICD-10-CM) - Lumbosacral radiculopathy at S1  THERAPY DIAG:  Other low back pain  Muscle weakness (generalized)  Rationale for Evaluation and Treatment Rehabilitation  PERTINENT HISTORY: HTN   SUBJECTIVE: Pt reports 5.5/10 pain today. He reports adherence to his HEP.  PAIN:  Are you having pain?  Yes: NPRS scale: 5.5/10 (10/10 at worst) Pain location: R lower back, R gluteal, R LE to foot Aggravating factors: prolonged standing, walking Relieving factors: some exercise   OBJECTIVE: (objective measures completed at initial evaluation unless otherwise dated)   DIAGNOSTIC FINDINGS:  CLINICAL DATA:  Initial evaluation for low back pain.   EXAM: MRI LUMBAR SPINE WITHOUT CONTRAST   TECHNIQUE: Multiplanar, multisequence MR imaging of the lumbar spine was performed. No intravenous contrast was administered.   COMPARISON:  None available.   FINDINGS: Segmentation: Standard. Lowest well-formed disc space labeled the L5-S1 level.   Alignment: 3 mm retrolisthesis of L5 on  S1. Straightening with mild reversal of the normal lumbar lordosis.   Vertebrae: Vertebral body height maintained without acute or chronic fracture. Bone marrow signal intensity within normal limits. No discrete or worrisome osseous lesions. Discogenic reactive endplate change present about the L5-S1 interspace. No other abnormal marrow edema.   Conus medullaris and cauda equina: Conus extends to the L1 level. Conus and cauda equina appear normal.   Paraspinal and other soft tissues: Unremarkable.   Disc levels:   No significant findings are seen through the L4-5 level.   L5-S1: Retrolisthesis. Diffuse disc bulge with disc desiccation and reactive endplate spurring. Superimposed moderate-sized right subarticular disc protrusion with slight inferior angulation. Protruding disc contacts and displaces the descending right S1 nerve root in the right lateral recess. Moderate right lateral recess stenosis. Central canal remains patent. Mild to moderate bilateral L5 foraminal stenosis at this level.   IMPRESSION: 1. Moderate-sized right subarticular disc protrusion at L5-S1, contacting and displacing the descending right S1 nerve root. 2. Mild to moderate bilateral L5 foraminal stenosis related to disc bulge and reactive endplate change.   PATIENT SURVEYS:   FOTO: 47% function; 60% predicted   COGNITION:           Overall cognitive status: Within functional limits for tasks assessed                          SENSATION: WFL   POSTURE:  Medium body habitus; slumped sitting posture, decreased lumbar lordosis   PALPATION: TTP to R lumbar paraspinals, R gluteals   LUMBAR ROM:    Active  A/PROM  eval  Flexion WFL p!  Extension WFL p!  Right lateral flexion WFL  Left lateral flexion WFL  Right rotation WFL  Left rotation WFL   (Blank rows = not tested)   LOWER EXTREMITY MMT:     MMT Right 07/17/2022  Left 07/17/2022   Hip flexion       Hip extension      Hip  abduction      Hip adduction      Hip external rotation      Hip internal rotation      Knee extension      Knee flexion      Ankle dorsiflexion       Ankle plantarflexion      Ankle inversion      Ankle eversion      Grossly WNL WNL  (Blank rows = not tested)   LUMBAR SPECIAL TESTS:  Straight leg raise test: Positive and Slump test: Positive   FUNCTIONAL TESTS:  30 Second Sit to Stand: 16 reps POE: No change in peripheral symptoms   GAIT: Distance walked: 78ft Assistive device utilized: None Level of assistance: Complete Independence Comments: slight antalgic gait R   TODAY'S TREATMENT   [facility] Adult PT Treatment:                                                DATE: 07/25/2022 Therapeutic Exercise: Straight-arm reverse crunches with 2kg ball 3x8 25# kettlebell dead lifts 3x10  Manual Therapy: Sidelying lumbar open book with PT overpressure gentle rocking x24min BIL Skilled palpation to identify trigger points prior to E-Stim TPDN x3 minutes Prone STM to lumbar paraspinals x6 minutes Neuromuscular re-ed: Attended E-stim TPDN, parameters below, x8 minutes Therapeutic Activity: N/A Modalities: N/A Self Care: N/A  Trigger Point Dry-Needling  Treatment instructions: Expect mild to moderate muscle soreness. S/S of pneumothorax if dry needled over a lung field, and to seek immediate medical attention should they occur. Patient verbalized understanding of these instructions and education.  Patient Consent Given: Yes Education handout provided: Yes Muscles treated: BIL l2-L4 lumbar multifidi Electrical stimulation performed: Yes Parameters:  12mA, level 2 intensity x8 minutes Treatment response/outcome: Improved muscle extensibility, decreased pain   [facility] Adult PT Treatment:                                                DATE: 07/17/2022 Therapeutic Exercise: Seated sciatic nerve glide x 10 R Supine PPT x 5 - 5" hold Bridge x 5  Supine piriformis stretch x 30" R Supine  hamstring stretch with strap x 30" R Seated fig 4 stretch x 30" R   PATIENT EDUCATION:  Education details: eval findings, FOTO, HEP, POC Person educated: Patient Education method: Explanation, Demonstration, and Handouts Education comprehension: verbalized understanding and returned demonstration     HOME EXERCISE PROGRAM: Access Code: [email or web address] URL: [email or web address] Date: 07/17/2022 Prepared by: Edwinna Areola   Exercises - Seated Sciatic Tensioner  - 3 x daily - 7 x weekly - 3 sets - 10 reps - Supine Posterior Pelvic Tilt  -  1 x daily - 7 x weekly - 2 sets - 10 reps - 5 sec hold - Supine Bridge  - 1 x daily - 7 x weekly - 3 sets - 10 reps - 3 sec hold - Hooklying Hamstring Stretch with Strap  - 1 x daily - 7 x weekly - 2-3 reps - 30 sec hold - Supine Piriformis Stretch with Leg Straight  - 1 x daily - 7 x weekly - 2-3 reps - 30 sec hold - Seated Figure 4 Piriformis Stretch  - 1 x daily - 7 x weekly - 2-3 reps - 30 sec hold   ASSESSMENT:   CLINICAL IMPRESSION: Pt responded well to all interventions today, demonstrating good form and no increase in pain with exercises. He reports a therapeutic response to manual techniques today and E-stim TPDN, reporting a decrease in pain from 5.5/10 to 4/10 following interventions. He will continue to benefit from skilled PT to address his primary impairments and return to his prior level of function with less limitation.     OBJECTIVE IMPAIRMENTS decreased activity tolerance, decreased mobility, decreased strength, and pain    ACTIVITY LIMITATIONS carrying, lifting, bending, standing, squatting, stairs, and transfers   PARTICIPATION LIMITATIONS: community activity, occupation, and yard work   PERSONAL FACTORS Time since onset of injury/illness/exacerbation and 1 comorbidity: HTN  are also affecting patient's functional outcome.        GOALS: Goals reviewed with patient? No   SHORT TERM GOALS: Target date: 08/07/2022   Pt  will be compliant and knowledgeable with initial HEP for improved comfort and carryover Baseline: initial HEP given  Goal status: INITIAL   2.  Pt will self report lower back and R LE pain no greater than 6/10 for improved comfort and functional ability Baseline: 10/10 at worst Goal status: INITIAL   LONG TERM GOALS: Target date: 09/11/2022   Pt will improve FOTO function score to no less than 60% as proxy for functional improvement Baseline: 47% function Goal status: INITIAL   2.  Pt will self report lower back and R LE pain no greater than 1-2/10 for improved comfort and functional ability Baseline: 10/10 at worst Goal status: INITIAL   3.  Pt will self report improved standing tolerance to >2 hours without increase in pain at work for improved comfort and function Baseline: <1hr Goal status: INITIAL   PLAN: PT FREQUENCY: 1-2x/week   PT DURATION: 8 weeks   PLANNED INTERVENTIONS: Therapeutic exercises, Therapeutic activity, Neuromuscular re-education, Balance training, Gait training, Patient/Family education, Self Care, Joint mobilization, Dry Needling, Electrical stimulation, Cryotherapy, Moist heat, Vasopneumatic device, Manual therapy, and Re-evaluation.   PLAN FOR NEXT SESSION: assess HEP response, progress neutral spine core strength, TPDN to lumbar paraspinals and R gluteals   Carmelina Dane, PT, DPT 07/25/22 6:28 PM

## 2022-07-26 ENCOUNTER — Ambulatory Visit: Payer: 59 | Admitting: Physical Medicine & Rehabilitation

## 2022-07-29 NOTE — Therapy (Unsigned)
OUTPATIENT PHYSICAL THERAPY TREATMENT NOTE   Patient Name: Jaime Barton MRN: [id number] DOB:08/05/1981, 41 y.o., male Today's Date: 08/01/2022  PCP: Ralene Ok, MD REFERRING PROVIDER: Fanny Dance, MD  END OF SESSION:   PT End of Session - 08/01/22 1834     Visit Number 4    Number of Visits 17    Date for PT Re-Evaluation 09/11/22    Authorization Type [employer]    Activity Tolerance Patient tolerated treatment well    Behavior During Therapy [facility] for tasks assessed/performed              Past Medical History:  Diagnosis Date   Hypertension    Past Surgical History:  Procedure Laterality Date   arm surgery  2012   left arm fracture repair   Patient Active Problem List   Diagnosis Date Noted   Acute right-sided low back pain with right-sided sciatica 03/29/2022    REFERRING DIAG: M54.17 (ICD-10-CM) - Lumbosacral radiculopathy at S1  THERAPY DIAG:  Other low back pain  Muscle weakness (generalized)  Rationale for Evaluation and Treatment Rehabilitation  PERTINENT HISTORY: HTN   SUBJECTIVE: Symptoms localized to R hip and posterior R thigh region, aggravated by sitting and prolonged positioning.   PAIN:  Are you having pain?  Yes: NPRS scale: 5.5/10 (10/10 at worst) Pain location: R lower back, R gluteal, R LE to foot Aggravating factors: prolonged standing, walking Relieving factors: some exercise   OBJECTIVE: (objective measures completed at initial evaluation unless otherwise dated)   DIAGNOSTIC FINDINGS:  CLINICAL DATA:  Initial evaluation for low back pain.   EXAM: MRI LUMBAR SPINE WITHOUT CONTRAST   TECHNIQUE: Multiplanar, multisequence MR imaging of the lumbar spine was performed. No intravenous contrast was administered.   COMPARISON:  None available.   FINDINGS: Segmentation: Standard. Lowest well-formed disc space labeled the L5-S1 level.   Alignment: 3 mm retrolisthesis of L5 on S1. Straightening with mild reversal  of the normal lumbar lordosis.   Vertebrae: Vertebral body height maintained without acute or chronic fracture. Bone marrow signal intensity within normal limits. No discrete or worrisome osseous lesions. Discogenic reactive endplate change present about the L5-S1 interspace. No other abnormal marrow edema.   Conus medullaris and cauda equina: Conus extends to the L1 level. Conus and cauda equina appear normal.   Paraspinal and other soft tissues: Unremarkable.   Disc levels:   No significant findings are seen through the L4-5 level.   L5-S1: Retrolisthesis. Diffuse disc bulge with disc desiccation and reactive endplate spurring. Superimposed moderate-sized right subarticular disc protrusion with slight inferior angulation. Protruding disc contacts and displaces the descending right S1 nerve root in the right lateral recess. Moderate right lateral recess stenosis. Central canal remains patent. Mild to moderate bilateral L5 foraminal stenosis at this level.   IMPRESSION: 1. Moderate-sized right subarticular disc protrusion at L5-S1, contacting and displacing the descending right S1 nerve root. 2. Mild to moderate bilateral L5 foraminal stenosis related to disc bulge and reactive endplate change.   PATIENT SURVEYS:   FOTO: 47% function; 60% predicted   COGNITION:           Overall cognitive status: Within functional limits for tasks assessed                          SENSATION: WFL   POSTURE:            Medium body habitus; slumped sitting posture, decreased lumbar lordosis  PALPATION: TTP to R lumbar paraspinals, R gluteals   LUMBAR ROM:    Active  A/PROM  eval  Flexion WFL p!  Extension WFL p!  Right lateral flexion WFL  Left lateral flexion WFL  Right rotation WFL  Left rotation WFL   (Blank rows = not tested)   LOWER EXTREMITY MMT:     MMT Right 07/17/2022  Left 07/17/2022   Hip flexion       Hip extension      Hip abduction      Hip adduction       Hip external rotation      Hip internal rotation      Knee extension      Knee flexion      Ankle dorsiflexion       Ankle plantarflexion      Ankle inversion      Ankle eversion      Grossly WNL WNL  (Blank rows = not tested)   LUMBAR SPECIAL TESTS:  Straight leg raise test: Positive and Slump test: Positive   FUNCTIONAL TESTS:  30 Second Sit to Stand: 16 reps POE: No change in peripheral symptoms   GAIT: Distance walked: 65ft Assistive device utilized: None Level of assistance: Complete Independence Comments: slight antalgic gait R   TODAY'S TREATMENT  [facility] Adult PT Treatment:                                                DATE: 08/01/22 Therapeutic Exercise: Nustep L4 8 min R hip flexor stretch 30s x1 R piriformis stretch fig 4 cross body 30s x3 w/towel SLR B 15x Abduction R 15x Single leg bridge 15/15 Bridge with ball 15x Bridge against GTB 15x R clams 15x2 Seated hamstring stretch R 30s x3 Manual Therapy: R piriformis release 8 min   [facility] Adult PT Treatment:                                                DATE: 07/25/2022 Therapeutic Exercise: Straight-arm reverse crunches with 2kg ball 3x8 25# kettlebell dead lifts 3x10  Manual Therapy: Sidelying lumbar open book with PT overpressure gentle rocking x15min BIL Skilled palpation to identify trigger points prior to E-Stim TPDN x3 minutes Prone STM to lumbar paraspinals x6 minutes Neuromuscular re-ed: Attended E-stim TPDN, parameters below, x8 minutes Therapeutic Activity: N/A Modalities: N/A Self Care: N/A  Trigger Point Dry-Needling  Treatment instructions: Expect mild to moderate muscle soreness. S/S of pneumothorax if dry needled over a lung field, and to seek immediate medical attention should they occur. Patient verbalized understanding of these instructions and education.  Patient Consent Given: Yes Education handout provided: Yes Muscles treated: BIL l2-L4 lumbar multifidi Electrical stimulation  performed: Yes Parameters:  81mA, level 2 intensity x8 minutes Treatment response/outcome: Improved muscle extensibility, decreased pain   [facility] Adult PT Treatment:                                                DATE: 07/17/2022 Therapeutic Exercise: Seated sciatic nerve glide x 10 R Supine PPT x 5 - 5" hold  Bridge x 5  Supine piriformis stretch x 30" R Supine hamstring stretch with strap x 30" R Seated fig 4 stretch x 30" R   PATIENT EDUCATION:  Education details: eval findings, FOTO, HEP, POC Person educated: Patient Education method: Explanation, Demonstration, and Handouts Education comprehension: verbalized understanding and returned demonstration     HOME EXERCISE PROGRAM: Access Code: [email or web address] URL: [email or web address] Date: 07/17/2022 Prepared by: Edwinna Areola   Exercises - Seated Sciatic Tensioner  - 3 x daily - 7 x weekly - 3 sets - 10 reps - Supine Posterior Pelvic Tilt  - 1 x daily - 7 x weekly - 2 sets - 10 reps - 5 sec hold - Supine Bridge  - 1 x daily - 7 x weekly - 3 sets - 10 reps - 3 sec hold - Hooklying Hamstring Stretch with Strap  - 1 x daily - 7 x weekly - 2-3 reps - 30 sec hold - Supine Piriformis Stretch with Leg Straight  - 1 x daily - 7 x weekly - 2-3 reps - 30 sec hold - Seated Figure 4 Piriformis Stretch  - 1 x daily - 7 x weekly - 2-3 reps - 30 sec hold   ASSESSMENT:   CLINICAL IMPRESSION: Today's treatment focus was R hip and lumbosacral stretching and strengthening tasks.  Marked TP noted in R piriformis.  Less ain noted at end of essio when ambulating however patient also took pain meds prior to PT     OBJECTIVE IMPAIRMENTS decreased activity tolerance, decreased mobility, decreased strength, and pain    ACTIVITY LIMITATIONS carrying, lifting, bending, standing, squatting, stairs, and transfers   PARTICIPATION LIMITATIONS: community activity, occupation, and yard work   PERSONAL FACTORS Time since onset of  injury/illness/exacerbation and 1 comorbidity: HTN  are also affecting patient's functional outcome.        GOALS: Goals reviewed with patient? No   SHORT TERM GOALS: Target date: 08/07/2022   Pt will be compliant and knowledgeable with initial HEP for improved comfort and carryover Baseline: initial HEP given  Goal status: INITIAL   2.  Pt will self report lower back and R LE pain no greater than 6/10 for improved comfort and functional ability Baseline: 10/10 at worst Goal status: INITIAL   LONG TERM GOALS: Target date: 09/11/2022   Pt will improve FOTO function score to no less than 60% as proxy for functional improvement Baseline: 47% function Goal status: INITIAL   2.  Pt will self report lower back and R LE pain no greater than 1-2/10 for improved comfort and functional ability Baseline: 10/10 at worst Goal status: INITIAL   3.  Pt will self report improved standing tolerance to >2 hours without increase in pain at work for improved comfort and function Baseline: <1hr Goal status: INITIAL   PLAN: PT FREQUENCY: 1-2x/week   PT DURATION: 8 weeks   PLANNED INTERVENTIONS: Therapeutic exercises, Therapeutic activity, Neuromuscular re-education, Balance training, Gait training, Patient/Family education, Self Care, Joint mobilization, Dry Needling, Electrical stimulation, Cryotherapy, Moist heat, Vasopneumatic device, Manual therapy, and Re-evaluation.   PLAN FOR NEXT SESSION: assess HEP response, progress neutral spine core strength, TPDN to lumbar paraspinals and R gluteals    08/01/22 7:16 PM

## 2022-07-30 ENCOUNTER — Ambulatory Visit: Payer: Commercial Managed Care - HMO | Attending: Physical Medicine & Rehabilitation | Admitting: Physical Therapy

## 2022-07-30 ENCOUNTER — Encounter: Payer: Self-pay | Admitting: Physical Therapy

## 2022-07-30 DIAGNOSIS — M6281 Muscle weakness (generalized): Secondary | ICD-10-CM | POA: Diagnosis present

## 2022-07-30 DIAGNOSIS — M5459 Other low back pain: Secondary | ICD-10-CM | POA: Insufficient documentation

## 2022-07-30 NOTE — Therapy (Signed)
OUTPATIENT PHYSICAL THERAPY TREATMENT NOTE   Patient Name: Jaime Barton MRN: [id number] DOB:14-Dec-1981, 41 y.o., male Today's Date: 07/30/2022  PCP: Ralene Ok, MD REFERRING PROVIDER: Fanny Dance, MD  END OF SESSION:   PT End of Session - 07/30/22 1745     Visit Number 3    Number of Visits 17    Date for PT Re-Evaluation 09/11/22    Authorization Type [employer]    PT Start Time 0545    PT Stop Time 0625    PT Time Calculation (min) 40 min    Activity Tolerance Patient tolerated treatment well    Behavior During Therapy [facility] for tasks assessed/performed             Past Medical History:  Diagnosis Date   Hypertension    Past Surgical History:  Procedure Laterality Date   arm surgery  2012   left arm fracture repair   Patient Active Problem List   Diagnosis Date Noted   Acute right-sided low back pain with right-sided sciatica 03/29/2022    REFERRING DIAG: M54.17 (ICD-10-CM) - Lumbosacral radiculopathy at S1  THERAPY DIAG:  Other low back pain  Muscle weakness (generalized)  Rationale for Evaluation and Treatment Rehabilitation  PERTINENT HISTORY: HTN   SUBJECTIVE: Pt reports TDN was somewhat helpful last visit.  PAIN:  Are you having pain?  Yes: NPRS scale: 5.5/10 (10/10 at worst) Pain location: R lower back, R gluteal, R LE to foot Aggravating factors: prolonged standing, walking Relieving factors: some exercise   OBJECTIVE: (objective measures completed at initial evaluation unless otherwise dated)   DIAGNOSTIC FINDINGS:  CLINICAL DATA:  Initial evaluation for low back pain.   EXAM: MRI LUMBAR SPINE WITHOUT CONTRAST   TECHNIQUE: Multiplanar, multisequence MR imaging of the lumbar spine was performed. No intravenous contrast was administered.   COMPARISON:  None available.   FINDINGS: Segmentation: Standard. Lowest well-formed disc space labeled the L5-S1 level.   Alignment: 3 mm retrolisthesis of L5 on S1. Straightening  with mild reversal of the normal lumbar lordosis.   Vertebrae: Vertebral body height maintained without acute or chronic fracture. Bone marrow signal intensity within normal limits. No discrete or worrisome osseous lesions. Discogenic reactive endplate change present about the L5-S1 interspace. No other abnormal marrow edema.   Conus medullaris and cauda equina: Conus extends to the L1 level. Conus and cauda equina appear normal.   Paraspinal and other soft tissues: Unremarkable.   Disc levels:   No significant findings are seen through the L4-5 level.   L5-S1: Retrolisthesis. Diffuse disc bulge with disc desiccation and reactive endplate spurring. Superimposed moderate-sized right subarticular disc protrusion with slight inferior angulation. Protruding disc contacts and displaces the descending right S1 nerve root in the right lateral recess. Moderate right lateral recess stenosis. Central canal remains patent. Mild to moderate bilateral L5 foraminal stenosis at this level.   IMPRESSION: 1. Moderate-sized right subarticular disc protrusion at L5-S1, contacting and displacing the descending right S1 nerve root. 2. Mild to moderate bilateral L5 foraminal stenosis related to disc bulge and reactive endplate change.   PATIENT SURVEYS:   FOTO: 47% function; 60% predicted   COGNITION:           Overall cognitive status: Within functional limits for tasks assessed                          SENSATION: WFL   POSTURE:  Medium body habitus; slumped sitting posture, decreased lumbar lordosis   PALPATION: TTP to R lumbar paraspinals, R gluteals   LUMBAR ROM:    Active  A/PROM  eval  Flexion WFL p!  Extension WFL p!  Right lateral flexion WFL  Left lateral flexion WFL  Right rotation WFL  Left rotation WFL   (Blank rows = not tested)   LOWER EXTREMITY MMT:     MMT Right 07/17/2022  Left 07/17/2022   Hip flexion       Hip extension      Hip abduction       Hip adduction      Hip external rotation      Hip internal rotation      Knee extension      Knee flexion      Ankle dorsiflexion       Ankle plantarflexion      Ankle inversion      Ankle eversion      Grossly WNL WNL  (Blank rows = not tested)   LUMBAR SPECIAL TESTS:  Straight leg raise test: Positive and Slump test: Positive   FUNCTIONAL TESTS:  30 Second Sit to Stand: 16 reps POE: No change in peripheral symptoms   GAIT: Distance walked: 74ft Assistive device utilized: None Level of assistance: Complete Independence Comments: slight antalgic gait R   TODAY'S TREATMENT   [facility] Adult PT Treatment:                                                DATE: 07/30/2022 Therapeutic Exercise: Bike - 5 min for warm up while taking subjective Sciatic nerve glide  Manual therapy: Skilled palpation to identify trigger points for TDN STM and TPR bil lumbar paraspinals  Trigger Point Dry-Needling  Treatment instructions: Expect mild to moderate muscle soreness. S/S of pneumothorax if dry needled over a lung field, and to seek immediate medical attention should they occur. Patient verbalized understanding of these instructions and education.  Patient Consent Given: Yes Education handout provided: Yes Muscles treated: BIL l2-L4 lumbar multifidi Electrical stimulation performed: Yes Parameters: 12 min low frequency - milli amps - low intensity; 3 min - micro amps - high frequency high intensity.  Treatment response/outcome: Improved muscle extensibility, decreased pain   [facility] Adult PT Treatment:                                                DATE: 07/25/2022 Therapeutic Exercise: Straight-arm reverse crunches with 2kg ball 3x8 25# kettlebell dead lifts 3x10  Manual Therapy: Sidelying lumbar open book with PT overpressure gentle rocking x49min BIL Skilled palpation to identify trigger points prior to E-Stim TPDN x3 minutes Prone STM to lumbar paraspinals x6 minutes Neuromuscular  re-ed: Attended E-stim TPDN, parameters below, x8 minutes Therapeutic Activity: N/A Modalities: N/A Self Care: N/A  Trigger Point Dry-Needling  Treatment instructions: Expect mild to moderate muscle soreness. S/S of pneumothorax if dry needled over a lung field, and to seek immediate medical attention should they occur. Patient verbalized understanding of these instructions and education.  Patient Consent Given: Yes Education handout provided: Yes Muscles treated: BIL l2-L4 lumbar multifidi Electrical stimulation performed: Yes Parameters:  75mA, level 2 intensity x8 minutes Treatment response/outcome:  Improved muscle extensibility, decreased pain   [facility] Adult PT Treatment:                                                DATE: 07/17/2022 Therapeutic Exercise: Seated sciatic nerve glide x 10 R Supine PPT x 5 - 5" hold Bridge x 5  Supine piriformis stretch x 30" R Supine hamstring stretch with strap x 30" R Seated fig 4 stretch x 30" R   PATIENT EDUCATION:  Education details: eval findings, FOTO, HEP, POC Person educated: Patient Education method: Explanation, Demonstration, and Handouts Education comprehension: verbalized understanding and returned demonstration     HOME EXERCISE PROGRAM: Access Code: [email or web address] URL: [email or web address] Date: 07/17/2022 Prepared by: Edwinna Areola   Exercises - Seated Sciatic Tensioner  - 3 x daily - 7 x weekly - 3 sets - 10 reps - Supine Posterior Pelvic Tilt  - 1 x daily - 7 x weekly - 2 sets - 10 reps - 5 sec hold - Supine Bridge  - 1 x daily - 7 x weekly - 3 sets - 10 reps - 3 sec hold - Hooklying Hamstring Stretch with Strap  - 1 x daily - 7 x weekly - 2-3 reps - 30 sec hold - Supine Piriformis Stretch with Leg Straight  - 1 x daily - 7 x weekly - 2-3 reps - 30 sec hold - Seated Figure 4 Piriformis Stretch  - 1 x daily - 7 x weekly - 2-3 reps - 30 sec hold   ASSESSMENT:   CLINICAL IMPRESSION: Santana tolerated session  well with no adverse events.  He reports significant pain reduction following manual therapy and TDN.  Will monitor for lasting effect.     OBJECTIVE IMPAIRMENTS decreased activity tolerance, decreased mobility, decreased strength, and pain    ACTIVITY LIMITATIONS carrying, lifting, bending, standing, squatting, stairs, and transfers   PARTICIPATION LIMITATIONS: community activity, occupation, and yard work   PERSONAL FACTORS Time since onset of injury/illness/exacerbation and 1 comorbidity: HTN  are also affecting patient's functional outcome.        GOALS: Goals reviewed with patient? No   SHORT TERM GOALS: Target date: 08/07/2022   Pt will be compliant and knowledgeable with initial HEP for improved comfort and carryover Baseline: initial HEP given  Goal status: INITIAL   2.  Pt will self report lower back and R LE pain no greater than 6/10 for improved comfort and functional ability Baseline: 10/10 at worst Goal status: INITIAL   LONG TERM GOALS: Target date: 09/11/2022   Pt will improve FOTO function score to no less than 60% as proxy for functional improvement Baseline: 47% function Goal status: INITIAL   2.  Pt will self report lower back and R LE pain no greater than 1-2/10 for improved comfort and functional ability Baseline: 10/10 at worst Goal status: INITIAL   3.  Pt will self report improved standing tolerance to >2 hours without increase in pain at work for improved comfort and function Baseline: <1hr Goal status: INITIAL   PLAN: PT FREQUENCY: 1-2x/week   PT DURATION: 8 weeks   PLANNED INTERVENTIONS: Therapeutic exercises, Therapeutic activity, Neuromuscular re-education, Balance training, Gait training, Patient/Family education, Self Care, Joint mobilization, Dry Needling, Electrical stimulation, Cryotherapy, Moist heat, Vasopneumatic device, Manual therapy, and Re-evaluation.   PLAN FOR NEXT SESSION: assess HEP response, progress neutral spine core  strength, TPDN to lumbar paraspinals and R gluteals   Kimberlee Nearing Zariana Strub PT 07/30/22 6:32 PM

## 2022-08-01 ENCOUNTER — Ambulatory Visit: Payer: Commercial Managed Care - HMO

## 2022-08-01 DIAGNOSIS — M6281 Muscle weakness (generalized): Secondary | ICD-10-CM

## 2022-08-01 DIAGNOSIS — M5459 Other low back pain: Secondary | ICD-10-CM

## 2022-08-05 ENCOUNTER — Ambulatory Visit: Payer: Self-pay

## 2022-08-05 ENCOUNTER — Encounter: Payer: Self-pay | Admitting: Physical Medicine and Rehabilitation

## 2022-08-05 ENCOUNTER — Ambulatory Visit (INDEPENDENT_AMBULATORY_CARE_PROVIDER_SITE_OTHER): Payer: Commercial Managed Care - HMO | Admitting: Physical Medicine and Rehabilitation

## 2022-08-05 VITALS — BP 138/93 | HR 82

## 2022-08-05 DIAGNOSIS — M5416 Radiculopathy, lumbar region: Secondary | ICD-10-CM | POA: Diagnosis not present

## 2022-08-05 MED ORDER — METHYLPREDNISOLONE ACETATE 80 MG/ML IJ SUSP
80.0000 mg | Freq: Once | INTRAMUSCULAR | Status: AC
  Administered 2022-08-05: 80 mg

## 2022-08-05 NOTE — Progress Notes (Signed)
Pt state lower back pain that travels down his right leg. Pt state any movement makes the pain worse. Pt state he takes pain meds to help ease his pain.  Numeric Pain Rating Scale and Functional Assessment Average Pain 7   In the last MONTH (on 0-10 scale) has pain interfered with the following?  1. General activity like being  able to carry out your everyday physical activities such as walking, climbing stairs, carrying groceries, or moving a chair?  Rating(9)   +Driver, -BT, -Dye Allergies.

## 2022-08-05 NOTE — Patient Instructions (Signed)

## 2022-08-06 ENCOUNTER — Ambulatory Visit: Payer: Commercial Managed Care - HMO

## 2022-08-08 ENCOUNTER — Ambulatory Visit: Payer: Commercial Managed Care - HMO

## 2022-08-08 DIAGNOSIS — M6281 Muscle weakness (generalized): Secondary | ICD-10-CM

## 2022-08-08 DIAGNOSIS — M5459 Other low back pain: Secondary | ICD-10-CM

## 2022-08-08 NOTE — Therapy (Signed)
OUTPATIENT PHYSICAL THERAPY TREATMENT NOTE   Patient Name: Jaime Barton MRN: [id number] DOB:10-16-1981, 41 y.o., male Today's Date: 08/08/2022  PCP: Jilda Panda, MD REFERRING PROVIDER: Jennye Boroughs, MD  END OF SESSION:   PT End of Session - 08/08/22 1745     Visit Number 5    Number of Visits 17    Date for PT Re-Evaluation 09/11/22    Authorization Type [employer]    PT Start Time 1745    PT Stop Time 1825    PT Time Calculation (min) 40 min    Activity Tolerance Patient tolerated treatment well    Behavior During Therapy [facility] for tasks assessed/performed               Past Medical History:  Diagnosis Date   Hypertension    Past Surgical History:  Procedure Laterality Date   arm surgery  2012   left arm fracture repair   Patient Active Problem List   Diagnosis Date Noted   Acute right-sided low back pain with right-sided sciatica 03/29/2022    REFERRING DIAG: M54.17 (ICD-10-CM) - Lumbosacral radiculopathy at S1  THERAPY DIAG:  Other low back pain  Muscle weakness (generalized)  Rationale for Evaluation and Treatment Rehabilitation  PERTINENT HISTORY: HTN   SUBJECTIVE: Patient reports he got a steroid injection on Monday that has helped his pain.  PAIN:  Are you having pain?  Yes: NPRS scale: 3.5/10 (4/10 at worst) Pain location: R lower back, R gluteal, R LE to foot Aggravating factors: prolonged standing, walking Relieving factors: some exercise   OBJECTIVE: (objective measures completed at initial evaluation unless otherwise dated)   DIAGNOSTIC FINDINGS:  CLINICAL DATA:  Initial evaluation for low back pain.   EXAM: MRI LUMBAR SPINE WITHOUT CONTRAST   TECHNIQUE: Multiplanar, multisequence MR imaging of the lumbar spine was performed. No intravenous contrast was administered.   COMPARISON:  None available.   FINDINGS: Segmentation: Standard. Lowest well-formed disc space labeled the L5-S1 level.   Alignment: 3 mm  retrolisthesis of L5 on S1. Straightening with mild reversal of the normal lumbar lordosis.   Vertebrae: Vertebral body height maintained without acute or chronic fracture. Bone marrow signal intensity within normal limits. No discrete or worrisome osseous lesions. Discogenic reactive endplate change present about the L5-S1 interspace. No other abnormal marrow edema.   Conus medullaris and cauda equina: Conus extends to the L1 level. Conus and cauda equina appear normal.   Paraspinal and other soft tissues: Unremarkable.   Disc levels:   No significant findings are seen through the L4-5 level.   L5-S1: Retrolisthesis. Diffuse disc bulge with disc desiccation and reactive endplate spurring. Superimposed moderate-sized right subarticular disc protrusion with slight inferior angulation. Protruding disc contacts and displaces the descending right S1 nerve root in the right lateral recess. Moderate right lateral recess stenosis. Central canal remains patent. Mild to moderate bilateral L5 foraminal stenosis at this level.   IMPRESSION: 1. Moderate-sized right subarticular disc protrusion at L5-S1, contacting and displacing the descending right S1 nerve root. 2. Mild to moderate bilateral L5 foraminal stenosis related to disc bulge and reactive endplate change.   PATIENT SURVEYS:   FOTO: 47% function; 60% predicted   COGNITION:           Overall cognitive status: Within functional limits for tasks assessed                          SENSATION: WFL   POSTURE:  Medium body habitus; slumped sitting posture, decreased lumbar lordosis   PALPATION: TTP to R lumbar paraspinals, R gluteals   LUMBAR ROM:    Active  A/PROM  eval  Flexion WFL p!  Extension WFL p!  Right lateral flexion WFL  Left lateral flexion WFL  Right rotation WFL  Left rotation WFL   (Blank rows = not tested)   LOWER EXTREMITY MMT:     MMT Right 07/17/2022  Left 07/17/2022   Hip flexion        Hip extension      Hip abduction      Hip adduction      Hip external rotation      Hip internal rotation      Knee extension      Knee flexion      Ankle dorsiflexion       Ankle plantarflexion      Ankle inversion      Ankle eversion      Grossly WNL WNL  (Blank rows = not tested)   LUMBAR SPECIAL TESTS:  Straight leg raise test: Positive and Slump test: Positive   FUNCTIONAL TESTS:  30 Second Sit to Stand: 16 reps POE: No change in peripheral symptoms   GAIT: Distance walked: 30f Assistive device utilized: None Level of assistance: Complete Independence Comments: slight antalgic gait R   TODAY'S TREATMENT  [facility] Adult PT Treatment:                                                DATE: 08/08/2022 Therapeutic Exercise: Nustep L5 x 5 mins Palloff press 13# 2x10 BIL Supine 90/90 isometric hold 3x30" Modified thomas stretch EOM Rt x1' with strap R piriformis stretch fig 4 2x30" BIL SLR B 15x Abduction R 15x Single leg bridge 15/15 Bridge with ball 15x Bridge against GTB 15x R clams 15x2 Seated hamstring stretch R 30s x3 Sidelying open books x10 BIL LTR x 10 BIL  [facility] Adult PT Treatment:                                                DATE: 08/01/22 Therapeutic Exercise: Nustep L4 8 min R hip flexor stretch 30s x1 R piriformis stretch fig 4 cross body 30s x3 w/towel SLR B 15x Abduction R 15x Single leg bridge 15/15 Bridge with ball 15x Bridge against GTB 15x R clams 15x2 Seated hamstring stretch R 30s x2 Manual Therapy: R piriformis release 8 min   [facility] Adult PT Treatment:                                                DATE: 07/25/2022 Therapeutic Exercise: Straight-arm reverse crunches with 2kg ball 3x8 25# kettlebell dead lifts 3x10  Manual Therapy: Sidelying lumbar open book with PT overpressure gentle rocking x217m BIL Skilled palpation to identify trigger points prior to E-Stim TPDN x3 minutes Prone STM to lumbar paraspinals x6 minutes Neuromuscular  re-ed: Attended E-stim TPDN, parameters below, x8 minutes Therapeutic Activity: N/A Modalities: N/A Self Care: N/A  Trigger Point Dry-Needling  Treatment instructions: Expect mild to moderate muscle  soreness. S/S of pneumothorax if dry needled over a lung field, and to seek immediate medical attention should they occur. Patient verbalized understanding of these instructions and education.  Patient Consent Given: Yes Education handout provided: Yes Muscles treated: BIL l2-L4 lumbar multifidi Electrical stimulation performed: Yes Parameters:  34m, level 2 intensity x8 minutes Treatment response/outcome: Improved muscle extensibility, decreased pain    PATIENT EDUCATION:  Education details: eval findings, FOTO, HEP, POC Person educated: Patient Education method: Explanation, Demonstration, and Handouts Education comprehension: verbalized understanding and returned demonstration     HOME EXERCISE PROGRAM: Access Code: [email or web address]: [email or web address] Date: 07/17/2022 Prepared by: DOctavio Manns  Exercises - Seated Sciatic Tensioner  - 3 x daily - 7 x weekly - 3 sets - 10 reps - Supine Posterior Pelvic Tilt  - 1 x daily - 7 x weekly - 2 sets - 10 reps - 5 sec hold - Supine Bridge  - 1 x daily - 7 x weekly - 3 sets - 10 reps - 3 sec hold - Hooklying Hamstring Stretch with Strap  - 1 x daily - 7 x weekly - 2-3 reps - 30 sec hold - Supine Piriformis Stretch with Leg Straight  - 1 x daily - 7 x weekly - 2-3 reps - 30 sec hold - Seated Figure 4 Piriformis Stretch  - 1 x daily - 7 x weekly - 2-3 reps - 30 sec hold   ASSESSMENT:   CLINICAL IMPRESSION: Patient presents to PT with lessened overall back pain and reports a steroid injection on Monday has helped decrease his pain. Session today focused on proximal hip and core strengthening and stretching for hip flexors and piriformis. Patient was able to tolerate all prescribed exercises with no adverse effects. Patient  continues to benefit from skilled PT services and should be progressed as able to improve functional independence.     OBJECTIVE IMPAIRMENTS decreased activity tolerance, decreased mobility, decreased strength, and pain    ACTIVITY LIMITATIONS carrying, lifting, bending, standing, squatting, stairs, and transfers   PARTICIPATION LIMITATIONS: community activity, occupation, and yard work   PERSONAL FACTORS Time since onset of injury/illness/exacerbation and 1 comorbidity: HTN  are also affecting patient's functional outcome.        GOALS: Goals reviewed with patient? No   SHORT TERM GOALS: Target date: 08/07/2022   Pt will be compliant and knowledgeable with initial HEP for improved comfort and carryover Baseline: initial HEP given  Goal status: MET Pt reports adherence 08/08/22   2.  Pt will self report lower back and R LE pain no greater than 6/10 for improved comfort and functional ability Baseline: 10/10 at worst Goal status: MET Pt reports 4/10 on 08/08/22   LONG TERM GOALS: Target date: 09/11/2022   Pt will improve FOTO function score to no less than 60% as proxy for functional improvement Baseline: 47% function Goal status: INITIAL   2.  Pt will self report lower back and R LE pain no greater than 1-2/10 for improved comfort and functional ability Baseline: 10/10 at worst Goal status: INITIAL   3.  Pt will self report improved standing tolerance to >2 hours without increase in pain at work for improved comfort and function Baseline: <1hr Goal status: INITIAL   PLAN: PT FREQUENCY: 1-2x/week   PT DURATION: 8 weeks   PLANNED INTERVENTIONS: Therapeutic exercises, Therapeutic activity, Neuromuscular re-education, Balance training, Gait training, Patient/Family education, Self Care, Joint mobilization, Dry Needling, Electrical stimulation, Cryotherapy, Moist heat, Vasopneumatic device, Manual therapy, and Re-evaluation.  PLAN FOR NEXT SESSION: assess HEP response, progress  neutral spine core strength, TPDN to lumbar paraspinals and R gluteals    Margarette Canada, PTA 08/08/22 5:46 PM

## 2022-08-13 ENCOUNTER — Ambulatory Visit: Payer: Commercial Managed Care - HMO

## 2022-08-13 DIAGNOSIS — M5459 Other low back pain: Secondary | ICD-10-CM | POA: Diagnosis not present

## 2022-08-13 DIAGNOSIS — M6281 Muscle weakness (generalized): Secondary | ICD-10-CM

## 2022-08-13 NOTE — Therapy (Signed)
OUTPATIENT PHYSICAL THERAPY TREATMENT NOTE   Patient Name: Jaime Barton MRN: [id number] DOB:30-Jan-1981, 41 y.o., male Today's Date: 08/13/2022  PCP: Jilda Panda, MD REFERRING PROVIDER: Jennye Boroughs, MD  END OF SESSION:   PT End of Session - 08/13/22 1743     Visit Number 6    Number of Visits 17    Date for PT Re-Evaluation 09/11/22    Authorization Type [employer]    PT Start Time 1745    PT Stop Time 1825    PT Time Calculation (min) 40 min    Activity Tolerance Patient tolerated treatment well    Behavior During Therapy [facility] for tasks assessed/performed                Past Medical History:  Diagnosis Date   Hypertension    Past Surgical History:  Procedure Laterality Date   arm surgery  2012   left arm fracture repair   Patient Active Problem List   Diagnosis Date Noted   Acute right-sided low back pain with right-sided sciatica 03/29/2022    REFERRING DIAG: M54.17 (ICD-10-CM) - Lumbosacral radiculopathy at S1  THERAPY DIAG:  Other low back pain  Muscle weakness (generalized)  Rationale for Evaluation and Treatment Rehabilitation  PERTINENT HISTORY: HTN   SUBJECTIVE: Recent steroid injection benefit has worn off, symtoms have decreased slightly  PAIN:  Are you having pain?  Yes: NPRS scale: 3.5/10 (4/10 at worst) Pain location: R lower back, R gluteal, R LE to foot Aggravating factors: prolonged standing, walking Relieving factors: some exercise   OBJECTIVE: (objective measures completed at initial evaluation unless otherwise dated)   DIAGNOSTIC FINDINGS:  CLINICAL DATA:  Initial evaluation for low back pain.   EXAM: MRI LUMBAR SPINE WITHOUT CONTRAST   TECHNIQUE: Multiplanar, multisequence MR imaging of the lumbar spine was performed. No intravenous contrast was administered.   COMPARISON:  None available.   FINDINGS: Segmentation: Standard. Lowest well-formed disc space labeled the L5-S1 level.   Alignment: 3 mm  retrolisthesis of L5 on S1. Straightening with mild reversal of the normal lumbar lordosis.   Vertebrae: Vertebral body height maintained without acute or chronic fracture. Bone marrow signal intensity within normal limits. No discrete or worrisome osseous lesions. Discogenic reactive endplate change present about the L5-S1 interspace. No other abnormal marrow edema.   Conus medullaris and cauda equina: Conus extends to the L1 level. Conus and cauda equina appear normal.   Paraspinal and other soft tissues: Unremarkable.   Disc levels:   No significant findings are seen through the L4-5 level.   L5-S1: Retrolisthesis. Diffuse disc bulge with disc desiccation and reactive endplate spurring. Superimposed moderate-sized right subarticular disc protrusion with slight inferior angulation. Protruding disc contacts and displaces the descending right S1 nerve root in the right lateral recess. Moderate right lateral recess stenosis. Central canal remains patent. Mild to moderate bilateral L5 foraminal stenosis at this level.   IMPRESSION: 1. Moderate-sized right subarticular disc protrusion at L5-S1, contacting and displacing the descending right S1 nerve root. 2. Mild to moderate bilateral L5 foraminal stenosis related to disc bulge and reactive endplate change.   PATIENT SURVEYS:   FOTO: 47% function; 60% predicted   COGNITION:           Overall cognitive status: Within functional limits for tasks assessed                          SENSATION: WFL   POSTURE:  Medium body habitus; slumped sitting posture, decreased lumbar lordosis   PALPATION: TTP to R lumbar paraspinals, R gluteals   LUMBAR ROM:    Active  A/PROM  eval  Flexion WFL p!  Extension WFL p!  Right lateral flexion WFL  Left lateral flexion WFL  Right rotation WFL  Left rotation WFL   (Blank rows = not tested)   LOWER EXTREMITY MMT:     MMT Right 07/17/2022  Left 07/17/2022   Hip flexion        Hip extension      Hip abduction      Hip adduction      Hip external rotation      Hip internal rotation      Knee extension      Knee flexion      Ankle dorsiflexion       Ankle plantarflexion      Ankle inversion      Ankle eversion      Grossly WNL WNL  (Blank rows = not tested)   LUMBAR SPECIAL TESTS:  Straight leg raise test: Positive and Slump test: Positive   FUNCTIONAL TESTS:  30 Second Sit to Stand: 16 reps POE: No change in peripheral symptoms   GAIT: Distance walked: 13f Assistive device utilized: None Level of assistance: Complete [place] Comments: slight antalgic gait R   TODAY'S TREATMENT  [facility] Adult PT Treatment:                                                DATE: 08/13/22 Therapeutic Exercise: Nustep L6 x 8 mins Prone prop 2 min Prone press 2x10 with OP Supine 90/90 isometric hold 3x30" Modified thomas stretch EOM Rt x1' with strap R piriformis stretch fig 4 2x30" w/towel Single leg bridge B 15x SLR B 15x Abduction R 15x Single leg bridge 15/15 Bridge with ball 15x Bridge against GTB 15x R clams 15x2 Seated hamstring stretch R 30s x3 Sidelying open books x10 BIL LTR x 10 BIL  [facility] Adult PT Treatment:                                                DATE: 08/08/2022 Therapeutic Exercise: Nustep L5 x 5 mins Palloff press 13# 2x10 BIL Supine 90/90 isometric hold 3x30" Modified thomas stretch EOM Rt x1' with strap R piriformis stretch fig 4 2x30" BIL SLR B 15x Abduction R 15x Single leg bridge 15/15 Bridge with ball 15x Bridge against GTB 15x R clams 15x2 Seated hamstring stretch R 30s x3 Sidelying open books x10 BIL LTR x 10 BIL  [facility] Adult PT Treatment:                                                DATE: 08/01/22 Therapeutic Exercise: Nustep L4 8 min R hip flexor stretch 30s x1 R piriformis stretch fig 4 cross body 30s x3 w/towel SLR B 15x Abduction R 15x Single leg bridge 15/15 Bridge with ball 15x Bridge against GTB 15x R  clams 15x2 Seated hamstring stretch R 30s x2 Manual Therapy: R piriformis release 8 min  [facility] Adult PT Treatment:                                                DATE: 07/25/2022 Therapeutic Exercise: Straight-arm reverse crunches with 2kg ball 3x8 25# kettlebell dead lifts 3x10  Manual Therapy: Sidelying lumbar open book with PT overpressure gentle rocking x60mn BIL Skilled palpation to identify trigger points prior to E-Stim TPDN x3 minutes Prone STM to lumbar paraspinals x6 minutes Neuromuscular re-ed: Attended E-stim TPDN, parameters below, x8 minutes Therapeutic Activity: N/A Modalities: N/A Self Care: N/A  Trigger Point Dry-Needling  Treatment instructions: Expect mild to moderate muscle soreness. S/S of pneumothorax if dry needled over a lung field, and to seek immediate medical attention should they occur. Patient verbalized understanding of these instructions and education.  Patient Consent Given: Yes Education handout provided: Yes Muscles treated: BIL l2-L4 lumbar multifidi Electrical stimulation performed: Yes Parameters:  222m level 2 intensity x8 minutes Treatment response/outcome: Improved muscle extensibility, decreased pain    PATIENT EDUCATION:  Education details: eval findings, FOTO, HEP, POC Person educated: Patient Education method: Explanation, Demonstration, and Handouts Education comprehension: verbalized understanding and returned demonstration     HOME EXERCISE PROGRAM: Access Code: [email or web address]: [email or web address] Date: 07/17/2022 Prepared by: DaOctavio Manns Exercises - Seated Sciatic Tensioner  - 3 x daily - 7 x weekly - 3 sets - 10 reps - Supine Posterior Pelvic Tilt  - 1 x daily - 7 x weekly - 2 sets - 10 reps - 5 sec hold - Supine Bridge  - 1 x daily - 7 x weekly - 3 sets - 10 reps - 3 sec hold - Hooklying Hamstring Stretch with Strap  - 1 x daily - 7 x weekly - 2-3 reps - 30 sec hold - Supine Piriformis Stretch with  Leg Straight  - 1 x daily - 7 x weekly - 2-3 reps - 30 sec hold - Seated Figure 4 Piriformis Stretch  - 1 x daily - 7 x weekly - 2-3 reps - 30 sec hold   ASSESSMENT:   CLINICAL IMPRESSION: Limited benefit from recent injection, still noting RLE radicular symptoms.  Patient observed to maintain a flexed and slouched posture when seated.  R hamstrings remain tight, slump and nerve tension signs in seated negative.  HEP updated to add additional core and stretching tasks    OBJECTIVE IMPAIRMENTS decreased activity tolerance, decreased mobility, decreased strength, and pain    ACTIVITY LIMITATIONS carrying, lifting, bending, standing, squatting, stairs, and transfers   PARTICIPATION LIMITATIONS: community activity, occupation, and yard work   PERSONAL FACTORS Time since onset of injury/illness/exacerbation and 1 comorbidity: HTN  are also affecting patient's functional outcome.        GOALS: Goals reviewed with patient? No   SHORT TERM GOALS: Target date: 08/07/2022   Pt will be compliant and knowledgeable with initial HEP for improved comfort and carryover Baseline: initial HEP given  Goal status: MET Pt reports adherence 08/08/22   2.  Pt will self report lower back and R LE pain no greater than 6/10 for improved comfort and functional ability Baseline: 10/10 at worst Goal status: MET Pt reports 4/10 on 08/08/22   LONG TERM GOALS: Target date: 09/11/2022   Pt will improve FOTO function score to no less than 60% as proxy for functional improvement Baseline: 47% function; 08/13/22 47% Goal  status: INITIAL   2.  Pt will self report lower back and R LE pain no greater than 1-2/10 for improved comfort and functional ability Baseline: 10/10 at worst Goal status: INITIAL   3.  Pt will self report improved standing tolerance to >2 hours without increase in pain at work for improved comfort and function Baseline: <1hr Goal status: INITIAL   PLAN: PT FREQUENCY: 1-2x/week   PT  DURATION: 8 weeks   PLANNED INTERVENTIONS: Therapeutic exercises, Therapeutic activity, Neuromuscular re-education, Balance training, Gait training, Patient/Family education, Self Care, Joint mobilization, Dry Needling, Electrical stimulation, Cryotherapy, Moist heat, Vasopneumatic device, Manual therapy, and Re-evaluation.   PLAN FOR NEXT SESSION: assess HEP response, progress neutral spine core strength, TPDN to lumbar paraspinals and R gluteals, extension based activities    Margarette [place], [place] 08/13/22 6:27 PM

## 2022-08-14 NOTE — Procedures (Signed)
S1 Lumbosacral Transforaminal Epidural Steroid Injection - Sub-Pedicular Approach with Fluoroscopic Guidance   Patient: Jaime Barton      Date of Birth: 1981/12/22 MRN: [id number] PCP: Ralene Ok, MD      Visit Date: 08/05/2022   Universal Protocol:    Date/Time: 08/16/236:17 AM  Consent Given By: the patient  Position:  PRONE  Additional Comments: Vital signs were monitored before and after the procedure. Patient was prepped and draped in the usual sterile fashion. The correct patient, procedure, and site was verified.   Injection Procedure Details:  Procedure Site One Meds Administered:  Meds ordered this encounter  Medications   methylPREDNISolone acetate (DEPO-MEDROL) injection 80 mg    Laterality: Right  Location/Site:  S1 Foramen   Needle size: 22 ga.  Needle type: Spinal  Needle Placement: Transforaminal  Findings:   -Comments: Excellent flow of contrast along the nerve, nerve root and into the epidural space.  Epidurogram: Contrast epidurogram showed no nerve root cut off or restricted flow pattern.  Procedure Details: After squaring off the sacral end-plate to get a true AP view, the C-arm was positioned so that the best possible view of the S1 foramen was visualized. The soft tissues overlying this structure were infiltrated with 2-3 ml. of 1% Lidocaine without Epinephrine.    The spinal needle was inserted toward the target using a "trajectory" view along the fluoroscope beam.  Under AP and lateral visualization, the needle was advanced so it did not puncture dura. Biplanar projections were used to confirm position. Aspiration was confirmed to be negative for CSF and/or blood. A 1-2 ml. volume of Isovue-250 was injected and flow of contrast was noted at each level. Radiographs were obtained for documentation purposes.   After attaining the desired flow of contrast documented above, a 0.5 to 1.0 ml test dose of 0.25% Marcaine was injected into each  respective transforaminal space.  The patient was observed for 90 seconds post injection.  After no sensory deficits were reported, and normal lower extremity motor function was noted,   the above injectate was administered so that equal amounts of the injectate were placed at each foramen (level) into the transforaminal epidural space.   Additional Comments:  The patient tolerated the procedure well Dressing: Band-Aid with 2 x 2 sterile gauze    Post-procedure details: Patient was observed during the procedure. Post-procedure instructions were reviewed.  Patient left the clinic in stable condition.

## 2022-08-14 NOTE — Progress Notes (Signed)
Jaime Barton - 41 y.o. male MRN [id number]  Date of birth: 1981/04/30  Office Visit Note: Visit Date: 08/05/2022 PCP: Ralene Ok, MD Referred by: Ralene Ok, MD  Subjective: Chief Complaint  Patient presents with   Lower Back - Pain   Right Leg - Pain   HPI:  Jaime Barton is a 41 y.o. male who comes in today at the request of Dr. Glee Arvin for planned Right S1-2 Lumbar Transforaminal epidural steroid injection with fluoroscopic guidance.  The patient has failed conservative care including home exercise, medications, time and activity modification.  This injection will be diagnostic and hopefully therapeutic.  Please see requesting physician notes for further details and justification.   ROS Otherwise per HPI.  Assessment & Plan: Visit Diagnoses:    ICD-10-CM   1. Lumbar radiculopathy  M54.16 XR C-ARM NO REPORT    Epidural Steroid injection    methylPREDNISolone acetate (DEPO-MEDROL) injection 80 mg      Plan: No additional findings.   Meds & Orders:  Meds ordered this encounter  Medications   methylPREDNISolone acetate (DEPO-MEDROL) injection 80 mg    Orders Placed This Encounter  Procedures   XR C-ARM NO REPORT   Epidural Steroid injection    Follow-up: Return for visit to requesting provider as needed.   Procedures: No procedures performed  S1 Lumbosacral Transforaminal Epidural Steroid Injection - Sub-Pedicular Approach with Fluoroscopic Guidance   Patient: Jaime Barton      Date of Birth: January 13, 1981 MRN: [id number] PCP: Ralene Ok, MD      Visit Date: 08/05/2022   Universal Protocol:    Date/Time: 08/16/236:17 AM  Consent Given By: the patient  Position:  PRONE  Additional Comments: Vital signs were monitored before and after the procedure. Patient was prepped and draped in the usual sterile fashion. The correct patient, procedure, and site was verified.   Injection Procedure Details:  Procedure Site One Meds Administered:  Meds  ordered this encounter  Medications   methylPREDNISolone acetate (DEPO-MEDROL) injection 80 mg    Laterality: Right  Location/Site:  S1 Foramen   Needle size: 22 ga.  Needle type: Spinal  Needle Placement: Transforaminal  Findings:   -Comments: Excellent flow of contrast along the nerve, nerve root and into the epidural space.  Epidurogram: Contrast epidurogram showed no nerve root cut off or restricted flow pattern.  Procedure Details: After squaring off the sacral end-plate to get a true AP view, the C-arm was positioned so that the best possible view of the S1 foramen was visualized. The soft tissues overlying this structure were infiltrated with 2-3 ml. of 1% Lidocaine without Epinephrine.    The spinal needle was inserted toward the target using a "trajectory" view along the fluoroscope beam.  Under AP and lateral visualization, the needle was advanced so it did not puncture dura. Biplanar projections were used to confirm position. Aspiration was confirmed to be negative for CSF and/or blood. A 1-2 ml. volume of Isovue-250 was injected and flow of contrast was noted at each level. Radiographs were obtained for documentation purposes.   After attaining the desired flow of contrast documented above, a 0.5 to 1.0 ml test dose of 0.25% Marcaine was injected into each respective transforaminal space.  The patient was observed for 90 seconds post injection.  After no sensory deficits were reported, and normal lower extremity motor function was noted,   the above injectate was administered so that equal amounts of the injectate were placed at each foramen (  level) into the transforaminal epidural space.   Additional Comments:  The patient tolerated the procedure well Dressing: Band-Aid with 2 x 2 sterile gauze    Post-procedure details: Patient was observed during the procedure. Post-procedure instructions were reviewed.  Patient left the clinic in stable condition.   Clinical  History: MRI LUMBAR SPINE WITHOUT CONTRAST   TECHNIQUE: Multiplanar, multisequence MR imaging of the lumbar spine was performed. No intravenous contrast was administered.   COMPARISON:  None available.   FINDINGS: Segmentation: Standard. Lowest well-formed disc space labeled the L5-S1 level.   Alignment: 3 mm retrolisthesis of L5 on S1. Straightening with mild reversal of the normal lumbar lordosis.   Vertebrae: Vertebral body height maintained without acute or chronic fracture. Bone marrow signal intensity within normal limits. No discrete or worrisome osseous lesions. Discogenic reactive endplate change present about the L5-S1 interspace. No other abnormal marrow edema.   Conus medullaris and cauda equina: Conus extends to the L1 level. Conus and cauda equina appear normal.   Paraspinal and other soft tissues: Unremarkable.   Disc levels:   No significant findings are seen through the L4-5 level.   L5-S1: Retrolisthesis. Diffuse disc bulge with disc desiccation and reactive endplate spurring. Superimposed moderate-sized right subarticular disc protrusion with slight inferior angulation. Protruding disc contacts and displaces the descending right S1 nerve root in the right lateral recess. Moderate right lateral recess stenosis. Central canal remains patent. Mild to moderate bilateral L5 foraminal stenosis at this level.   IMPRESSION: 1. Moderate-sized right subarticular disc protrusion at L5-S1, contacting and displacing the descending right S1 nerve root. 2. Mild to moderate bilateral L5 foraminal stenosis related to disc bulge and reactive endplate change.     Electronically Signed   By: Rise Mu M.D.   On: 06/30/2022 05:12     Objective:  VS:  HT:    WT:   BMI:     BP:(!) 138/93  HR:82bpm  TEMP: ( )  RESP:  Physical Exam Vitals and nursing note reviewed.  Constitutional:      General: He is not in acute distress.    Appearance: Normal  appearance. He is not ill-appearing.  HENT:     Head: Normocephalic and atraumatic.     Right Ear: External ear normal.     Left Ear: External ear normal.     Nose: No congestion.  Eyes:     Extraocular Movements: Extraocular movements intact.  Cardiovascular:     Rate and Rhythm: Normal rate.     Pulses: Normal pulses.  Pulmonary:     Effort: Pulmonary effort is normal. No respiratory distress.  Abdominal:     General: There is no distension.     Palpations: Abdomen is soft.  Musculoskeletal:        General: No tenderness or signs of injury.     Cervical back: Neck supple.     Right lower leg: No edema.     Left lower leg: No edema.     Comments: Patient has good distal strength without clonus.  Skin:    Findings: No erythema or rash.  Neurological:     General: No focal deficit present.     Mental Status: He is alert and oriented to person, place, and time.     Sensory: No sensory deficit.     Motor: No weakness or abnormal muscle tone.     Coordination: Coordination normal.  Psychiatric:        Mood and Affect: Mood normal.  Behavior: Behavior normal.      Imaging: No results found.

## 2022-08-15 ENCOUNTER — Ambulatory Visit: Payer: Commercial Managed Care - HMO

## 2022-08-20 ENCOUNTER — Ambulatory Visit: Payer: Commercial Managed Care - HMO

## 2022-08-20 ENCOUNTER — Telehealth: Payer: Self-pay | Admitting: Orthopaedic Surgery

## 2022-08-20 DIAGNOSIS — M6281 Muscle weakness (generalized): Secondary | ICD-10-CM

## 2022-08-20 DIAGNOSIS — M5459 Other low back pain: Secondary | ICD-10-CM

## 2022-08-20 MED ORDER — HYDROCODONE-ACETAMINOPHEN 5-325 MG PO TABS: 1.0000 | ORAL_TABLET | Freq: Every day | ORAL | 0 refills | Status: DC | PRN

## 2022-08-20 NOTE — Therapy (Signed)
OUTPATIENT PHYSICAL THERAPY TREATMENT NOTE   Patient Name: Jaime Barton MRN: [id number] DOB:Jun 16, 1981, 41 y.o., male Today's Date: 08/20/2022  PCP: Jilda Panda, MD REFERRING PROVIDER: Jennye Boroughs, MD  END OF SESSION:   PT End of Session - 08/20/22 1746     Visit Number 7    Number of Visits 17    Date for PT Re-Evaluation 09/11/22    Authorization Type [employer]    PT Start Time 1745    PT Stop Time 1825    PT Time Calculation (min) 40 min    Activity Tolerance Patient tolerated treatment well    Behavior During Therapy [facility] for tasks assessed/performed                 Past Medical History:  Diagnosis Date   Hypertension    Past Surgical History:  Procedure Laterality Date   arm surgery  2012   left arm fracture repair   Patient Active Problem List   Diagnosis Date Noted   Acute right-sided low back pain with right-sided sciatica 03/29/2022    REFERRING DIAG: M54.17 (ICD-10-CM) - Lumbosacral radiculopathy at S1  THERAPY DIAG:  Other low back pain  Muscle weakness (generalized)  Rationale for Evaluation and Treatment Rehabilitation  PERTINENT HISTORY: HTN   SUBJECTIVE: Reports 6/10 pain, limited benefit from steroid injection 2-3 weeks ago.  Pain has stabilized and he reports less stiffness and discomfort when waking  PAIN:  Are you having pain?  Yes: NPRS scale: 3.5/10 (4/10 at worst) Pain location: R lower back, R gluteal, R LE to foot Aggravating factors: prolonged standing, walking Relieving factors: some exercise   OBJECTIVE: (objective measures completed at initial evaluation unless otherwise dated)   DIAGNOSTIC FINDINGS:  CLINICAL DATA:  Initial evaluation for low back pain.   EXAM: MRI LUMBAR SPINE WITHOUT CONTRAST   TECHNIQUE: Multiplanar, multisequence MR imaging of the lumbar spine was performed. No intravenous contrast was administered.   COMPARISON:  None available.   FINDINGS: Segmentation: Standard. Lowest  well-formed disc space labeled the L5-S1 level.   Alignment: 3 mm retrolisthesis of L5 on S1. Straightening with mild reversal of the normal lumbar lordosis.   Vertebrae: Vertebral body height maintained without acute or chronic fracture. Bone marrow signal intensity within normal limits. No discrete or worrisome osseous lesions. Discogenic reactive endplate change present about the L5-S1 interspace. No other abnormal marrow edema.   Conus medullaris and cauda equina: Conus extends to the L1 level. Conus and cauda equina appear normal.   Paraspinal and other soft tissues: Unremarkable.   Disc levels:   No significant findings are seen through the L4-5 level.   L5-S1: Retrolisthesis. Diffuse disc bulge with disc desiccation and reactive endplate spurring. Superimposed moderate-sized right subarticular disc protrusion with slight inferior angulation. Protruding disc contacts and displaces the descending right S1 nerve root in the right lateral recess. Moderate right lateral recess stenosis. Central canal remains patent. Mild to moderate bilateral L5 foraminal stenosis at this level.   IMPRESSION: 1. Moderate-sized right subarticular disc protrusion at L5-S1, contacting and displacing the descending right S1 nerve root. 2. Mild to moderate bilateral L5 foraminal stenosis related to disc bulge and reactive endplate change.   PATIENT SURVEYS:   FOTO: 47% function; 60% predicted   COGNITION:           Overall cognitive status: Within functional limits for tasks assessed  SENSATION: WFL   POSTURE:            Medium body habitus; slumped sitting posture, decreased lumbar lordosis   PALPATION: TTP to R lumbar paraspinals, R gluteals   LUMBAR ROM:    Active  A/PROM  eval  Flexion WFL p!  Extension WFL p!  Right lateral flexion WFL  Left lateral flexion WFL  Right rotation WFL  Left rotation WFL   (Blank rows = not tested)   LOWER EXTREMITY  MMT:     MMT Right 07/17/2022  Left 07/17/2022   Hip flexion       Hip extension      Hip abduction      Hip adduction      Hip external rotation      Hip internal rotation      Knee extension      Knee flexion      Ankle dorsiflexion       Ankle plantarflexion      Ankle inversion      Ankle eversion      Grossly WNL WNL  (Blank rows = not tested)   LUMBAR SPECIAL TESTS:  Straight leg raise test: Positive and Slump test: Positive   FUNCTIONAL TESTS:  30 Second Sit to Stand: 16 reps POE: No change in peripheral symptoms   GAIT: Distance walked: 7f Assistive device utilized: None Level of assistance: Complete Independence Comments: slight antalgic gait R   TODAY'S TREATMENT  [facility] Adult PT Treatment:                                                DATE: 08/20/22 Therapeutic Exercise: Nustep L6 x 8 mins Prone prop 2 min Prone press 2x10 with OP Supine 90/90 isometric hold 3x30" Modified thomas stretch EOM Rt x1' with strap R piriformis stretch fig 4 2x30" w/towel Single leg bridge B 15x SLR B 15x Abduction R 15x2 Single leg bridge 15/15 Bridge with ball 15x Bridge against GTB 15x R clams 15x2 Seated hamstring stretch R 30s x3 Sidelying open books x10 BIL LTR x 10 BIL   [facility] Adult PT Treatment:                                                DATE: 08/13/22 Therapeutic Exercise: Nustep L6 x 8 mins Prone prop 2 min Prone press 2x10 with OP Supine 90/90 isometric hold 3x30" Modified thomas stretch EOM Rt x1' with strap R piriformis stretch fig 4 2x30" w/towel Single leg bridge B 15x SLR B 15x Abduction R 15x Single leg bridge 15/15 Bridge with ball 15x Bridge against GTB 15x R clams 15x2 Seated hamstring stretch R 30s x3 Sidelying open books x10 BIL LTR x 10 BIL  [facility] Adult PT Treatment:                                                DATE: 08/08/2022 Therapeutic Exercise: Nustep L5 x 5 mins Palloff press 13# 2x10 BIL Supine 90/90 isometric hold  3x30" Modified thomas stretch EOM Rt x1' with strap R piriformis stretch fig 4  2x30" BIL SLR B 15x Abduction R 15x Single leg bridge 15/15 Bridge with ball 15x Bridge against GTB 15x R clams 15x2 Seated hamstring stretch R 30s x3 Sidelying open books x10 BIL LTR x 10 BIL    PATIENT EDUCATION:  Education details: eval findings, FOTO, HEP, POC Person educated: Patient Education method: Explanation, Demonstration, and Handouts Education comprehension: verbalized understanding and returned demonstration     HOME EXERCISE PROGRAM: Access Code: [email or web address] URL: [email or web address] Date: 07/17/2022 Prepared by: Octavio Manns   Exercises - Seated Sciatic Tensioner  - 3 x daily - 7 x weekly - 3 sets - 10 reps - Supine Posterior Pelvic Tilt  - 1 x daily - 7 x weekly - 2 sets - 10 reps - 5 sec hold - Supine Bridge  - 1 x daily - 7 x weekly - 3 sets - 10 reps - 3 sec hold - Hooklying Hamstring Stretch with Strap  - 1 x daily - 7 x weekly - 2-3 reps - 30 sec hold - Supine Piriformis Stretch with Leg Straight  - 1 x daily - 7 x weekly - 2-3 reps - 30 sec hold - Seated Figure 4 Piriformis Stretch  - 1 x daily - 7 x weekly - 2-3 reps - 30 sec hold   ASSESSMENT:   CLINICAL IMPRESSION: Continued c/o RLE radicular symptoms and discomfort. Added additional reps to strengthening tasks, piriformis stretching only minimally effective.  Symptoms onset after 30 min of prolonged sitting/standing.    OBJECTIVE IMPAIRMENTS decreased activity tolerance, decreased mobility, decreased strength, and pain    ACTIVITY LIMITATIONS carrying, lifting, bending, standing, squatting, stairs, and transfers   PARTICIPATION LIMITATIONS: community activity, occupation, and yard work   PERSONAL FACTORS Time since onset of injury/illness/exacerbation and 1 comorbidity: HTN  are also affecting patient's functional outcome.        GOALS: Goals reviewed with patient? No   SHORT TERM GOALS: Target  date: 08/07/2022   Pt will be compliant and knowledgeable with initial HEP for improved comfort and carryover Baseline: initial HEP given  Goal status: MET Pt reports adherence 08/08/22   2.  Pt will self report lower back and R LE pain no greater than 6/10 for improved comfort and functional ability Baseline: 10/10 at worst Goal status: MET Pt reports 4/10 on 08/08/22   LONG TERM GOALS: Target date: 09/11/2022   Pt will improve FOTO function score to no less than 60% as proxy for functional improvement Baseline: 47% function; 08/13/22 47% Goal status: INITIAL   2.  Pt will self report lower back and R LE pain no greater than 1-2/10 for improved comfort and functional ability Baseline: 10/10 at worst Goal status: INITIAL   3.  Pt will self report improved standing tolerance to >2 hours without increase in pain at work for improved comfort and function Baseline: <1hr Goal status: INITIAL   PLAN: PT FREQUENCY: 1-2x/week   PT DURATION: 8 weeks   PLANNED INTERVENTIONS: Therapeutic exercises, Therapeutic activity, Neuromuscular re-education, Balance training, Gait training, Patient/Family education, Self Care, Joint mobilization, Dry Needling, Electrical stimulation, Cryotherapy, Moist heat, Vasopneumatic device, Manual therapy, and Re-evaluation.   PLAN FOR NEXT SESSION: assess HEP response, progress neutral spine core strength, TPDN to lumbar paraspinals and R gluteals, extension based activities    Leroy Sea PT  08/20/22 5:47 PM

## 2022-08-20 NOTE — Telephone Encounter (Signed)
done

## 2022-08-20 NOTE — Telephone Encounter (Signed)
Pt needs refill on hydrocodone.  °

## 2022-08-21 ENCOUNTER — Telehealth: Payer: Self-pay | Admitting: Orthopaedic Surgery

## 2022-08-21 ENCOUNTER — Ambulatory Visit: Payer: Commercial Managed Care - HMO

## 2022-08-21 DIAGNOSIS — M5459 Other low back pain: Secondary | ICD-10-CM

## 2022-08-21 DIAGNOSIS — M6281 Muscle weakness (generalized): Secondary | ICD-10-CM

## 2022-08-21 NOTE — Telephone Encounter (Signed)
Patient called advised the Rx for Hydrocodone need prior approval before ir can be filled by the pharmacy. The number to contact patient is [phone]

## 2022-08-21 NOTE — Therapy (Signed)
OUTPATIENT PHYSICAL THERAPY TREATMENT NOTE   Patient Name: Jaime Barton MRN: [id number] DOB:1981-07-29, 41 y.o., male Today's Date: 08/21/2022  PCP: Jilda Panda, MD REFERRING PROVIDER: Jennye Boroughs, MD  END OF SESSION:   PT End of Session - 08/21/22 1745     Visit Number 8    Number of Visits 17    Date for PT Re-Evaluation 09/11/22    Authorization Type [employer]    PT Start Time 1745    PT Stop Time 1825    PT Time Calculation (min) 40 min    Activity Tolerance Patient tolerated treatment well    Behavior During Therapy [facility] for tasks assessed/performed             Past Medical History:  Diagnosis Date   Hypertension    Past Surgical History:  Procedure Laterality Date   arm surgery  2012   left arm fracture repair   Patient Active Problem List   Diagnosis Date Noted   Acute right-sided low back pain with right-sided sciatica 03/29/2022    REFERRING DIAG: M54.17 (ICD-10-CM) - Lumbosacral radiculopathy at S1  THERAPY DIAG:  Other low back pain  Muscle weakness (generalized)  Rationale for Evaluation and Treatment Rehabilitation  PERTINENT HISTORY: HTN   SUBJECTIVE: Patient reports 6/10 pain and radiating down RLE to foot. Steroid injection benefit has warn off.   PAIN:  Are you having pain?  Yes: NPRS scale: 6/10 Pain location: R lower back, R gluteal, R LE to foot Aggravating factors: prolonged standing, walking Relieving factors: some exercise   OBJECTIVE: (objective measures completed at initial evaluation unless otherwise dated)   DIAGNOSTIC FINDINGS:  CLINICAL DATA:  Initial evaluation for low back pain.   EXAM: MRI LUMBAR SPINE WITHOUT CONTRAST   TECHNIQUE: Multiplanar, multisequence MR imaging of the lumbar spine was performed. No intravenous contrast was administered.   COMPARISON:  None available.   FINDINGS: Segmentation: Standard. Lowest well-formed disc space labeled the L5-S1 level.   Alignment: 3 mm  retrolisthesis of L5 on S1. Straightening with mild reversal of the normal lumbar lordosis.   Vertebrae: Vertebral body height maintained without acute or chronic fracture. Bone marrow signal intensity within normal limits. No discrete or worrisome osseous lesions. Discogenic reactive endplate change present about the L5-S1 interspace. No other abnormal marrow edema.   Conus medullaris and cauda equina: Conus extends to the L1 level. Conus and cauda equina appear normal.   Paraspinal and other soft tissues: Unremarkable.   Disc levels:   No significant findings are seen through the L4-5 level.   L5-S1: Retrolisthesis. Diffuse disc bulge with disc desiccation and reactive endplate spurring. Superimposed moderate-sized right subarticular disc protrusion with slight inferior angulation. Protruding disc contacts and displaces the descending right S1 nerve root in the right lateral recess. Moderate right lateral recess stenosis. Central canal remains patent. Mild to moderate bilateral L5 foraminal stenosis at this level.   IMPRESSION: 1. Moderate-sized right subarticular disc protrusion at L5-S1, contacting and displacing the descending right S1 nerve root. 2. Mild to moderate bilateral L5 foraminal stenosis related to disc bulge and reactive endplate change.   PATIENT SURVEYS:   FOTO: 47% function; 60% predicted   COGNITION:           Overall cognitive status: Within functional limits for tasks assessed                          SENSATION: WFL   POSTURE:  Medium body habitus; slumped sitting posture, decreased lumbar lordosis   PALPATION: TTP to R lumbar paraspinals, R gluteals   LUMBAR ROM:    Active  A/PROM  eval  Flexion WFL p!  Extension WFL p!  Right lateral flexion WFL  Left lateral flexion WFL  Right rotation WFL  Left rotation WFL   (Blank rows = not tested)   LOWER EXTREMITY MMT:     MMT Right 07/17/2022  Left 07/17/2022   Hip flexion        Hip extension      Hip abduction      Hip adduction      Hip external rotation      Hip internal rotation      Knee extension      Knee flexion      Ankle dorsiflexion       Ankle plantarflexion      Ankle inversion      Ankle eversion      Grossly WNL WNL  (Blank rows = not tested)   LUMBAR SPECIAL TESTS:  Straight leg raise test: Positive and Slump test: Positive   FUNCTIONAL TESTS:  30 Second Sit to Stand: 16 reps POE: No change in peripheral symptoms   GAIT: Distance walked: 62f Assistive device utilized: None Level of assistance: Complete Independence Comments: slight antalgic gait R   TODAY'S TREATMENT  [facility] Adult PT Treatment:                                                DATE: 08/21/2022 Therapeutic Exercise: Nustep L6 x 5 mins Palloff press 10# 2x10 BIL Prone prop 2 min Supine 90/90 isometric hold 3x30" Supine 90/90 with alternating heel taps 2x10 BIL Modified thomas stretch EOM Rt x1' with strap R piriformis stretch fig 4 2x30" holding behind L thigh Single leg bridge B 15x SLR B 15x Bridge with ball 15x Bridge with clam GTB 15x BIL clams 15x GTB Seated hamstring stretch R 30s x3 Sidelying open books x10 BIL LTR x 10 BIL Seated sciatic nerve glides Rt x20  [facility] Adult PT Treatment:                                                DATE: 08/20/22 Therapeutic Exercise: Nustep L6 x 8 mins Prone prop 2 min Prone press 2x10 with OP Supine 90/90 isometric hold 3x30" Modified thomas stretch EOM Rt x1' with strap R piriformis stretch fig 4 2x30" w/towel Single leg bridge B 15x SLR B 15x Abduction R 15x2 Single leg bridge 15/15 Bridge with ball 15x Bridge against GTB 15x R clams 15x2 Seated hamstring stretch R 30s x3 Sidelying open books x10 BIL LTR x 10 BIL   [facility] Adult PT Treatment:                                                DATE: 08/13/22 Therapeutic Exercise: Nustep L6 x 8 mins Prone prop 2 min Prone press 2x10 with OP Supine 90/90  isometric hold 3x30" Modified thomas stretch EOM Rt x1' with strap R piriformis stretch fig 4  2x30" w/towel Single leg bridge B 15x SLR B 15x Abduction R 15x Single leg bridge 15/15 Bridge with ball 15x Bridge against GTB 15x R clams 15x2 Seated hamstring stretch R 30s x3 Sidelying open books x10 BIL LTR x 10 BIL    PATIENT EDUCATION:  Education details: eval findings, FOTO, HEP, POC Person educated: Patient Education method: Explanation, Demonstration, and Handouts Education comprehension: verbalized understanding and returned demonstration     HOME EXERCISE PROGRAM: Access Code: [email or web address] URL: [email or web address] Date: 07/17/2022 Prepared by: Octavio Manns   Exercises - Seated Sciatic Tensioner  - 3 x daily - 7 x weekly - 3 sets - 10 reps - Supine Posterior Pelvic Tilt  - 1 x daily - 7 x weekly - 2 sets - 10 reps - 5 sec hold - Supine Bridge  - 1 x daily - 7 x weekly - 3 sets - 10 reps - 3 sec hold - Hooklying Hamstring Stretch with Strap  - 1 x daily - 7 x weekly - 2-3 reps - 30 sec hold - Supine Piriformis Stretch with Leg Straight  - 1 x daily - 7 x weekly - 2-3 reps - 30 sec hold - Seated Figure 4 Piriformis Stretch  - 1 x daily - 7 x weekly - 2-3 reps - 30 sec hold   ASSESSMENT:   CLINICAL IMPRESSION: Patient presents to PT with continued low back pain and radicular symptoms and reports that the steroid injection he got a few weeks ago is no longer helping his pain. Session today focused on core and proximal hip strengthening. Patient was able to tolerate all prescribed exercises with no adverse effects. Patient continues to benefit from skilled PT services and should be progressed as able to improve functional independence.     OBJECTIVE IMPAIRMENTS decreased activity tolerance, decreased mobility, decreased strength, and pain    ACTIVITY LIMITATIONS carrying, lifting, bending, standing, squatting, stairs, and transfers   PARTICIPATION LIMITATIONS:  community activity, occupation, and yard work   PERSONAL FACTORS Time since onset of injury/illness/exacerbation and 1 comorbidity: HTN  are also affecting patient's functional outcome.        GOALS: Goals reviewed with patient? No   SHORT TERM GOALS: Target date: 08/07/2022   Pt will be compliant and knowledgeable with initial HEP for improved comfort and carryover Baseline: initial HEP given  Goal status: MET Pt reports adherence 08/08/22   2.  Pt will self report lower back and R LE pain no greater than 6/10 for improved comfort and functional ability Baseline: 10/10 at worst Goal status: MET Pt reports 4/10 on 08/08/22   LONG TERM GOALS: Target date: 09/11/2022   Pt will improve FOTO function score to no less than 60% as proxy for functional improvement Baseline: 47% function; 08/13/22 47% Goal status: INITIAL   2.  Pt will self report lower back and R LE pain no greater than 1-2/10 for improved comfort and functional ability Baseline: 10/10 at worst Goal status: INITIAL   3.  Pt will self report improved standing tolerance to >2 hours without increase in pain at work for improved comfort and function Baseline: <1hr Goal status: INITIAL   PLAN: PT FREQUENCY: 1-2x/week   PT DURATION: 8 weeks   PLANNED INTERVENTIONS: Therapeutic exercises, Therapeutic activity, Neuromuscular re-education, Balance training, Gait training, Patient/Family education, Self Care, Joint mobilization, Dry Needling, Electrical stimulation, Cryotherapy, Moist heat, Vasopneumatic device, Manual therapy, and Re-evaluation.   PLAN FOR NEXT SESSION: assess HEP response, progress neutral spine core strength, TPDN  to lumbar paraspinals and R gluteals, extension based activities    Margarette Canada, [place] 08/21/22 6:27 PM

## 2022-08-21 NOTE — Telephone Encounter (Signed)
Notified patient that I have sent in request for prior auth. Waiting on response of approval or not.

## 2022-08-27 ENCOUNTER — Ambulatory Visit: Payer: Commercial Managed Care - HMO | Admitting: Orthopaedic Surgery

## 2022-08-27 DIAGNOSIS — M5441 Lumbago with sciatica, right side: Secondary | ICD-10-CM

## 2022-08-27 NOTE — Progress Notes (Signed)
Office Visit Note   Patient: Jaime Barton           Date of Birth: 29-Apr-1981           MRN: [id number] Visit Date: 08/27/2022              Requested by: Ralene Ok, MD [id number] [place],  [place] [address] PCP: Ralene Ok, MD   Assessment & Plan: Visit Diagnoses:  1. Acute right-sided low back pain with right-sided sciatica     Plan: Impression is recurrent right-sided low back pain and right lower extremity radiculopathy.  At this point, I would like to refer this patient to Dr. Ophelia Charter for further evaluation and possible surgical intervention.  Call with concerns or questions in the meantime.  Follow-Up Instructions: Return for with Dr. Ophelia Charter.   Orders:  No orders of the defined types were placed in this encounter.  No orders of the defined types were placed in this encounter.     Procedures: No procedures performed   Clinical Data: No additional findings.   Subjective: Chief Complaint  Patient presents with   Lower Back - Follow-up    HPI patient is a 41 year old gentleman who comes in today with recurrent right-sided low back pain and right lower extremity radiculopathy.  We have seen him for this in the past where recent MRI showed L5-S1 disc protrusion as well as mild to moderate bilateral L5 foraminal stenosis.  He was referred to Dr. Alvester Morin where he underwent S1-2 lumbar transforaminal ESI with good relief but only lasted for a few days.  Symptoms have returned and have gradually started to worsen.  Not quite to the extent as they were prior to the injection.  He denies any new bowel or bladder change or saddle paresthesias.     Objective: Vital Signs: There were no vitals taken for this visit.    Ortho Exam stable lumbar spine exam  Specialty Comments:  MRI LUMBAR SPINE WITHOUT CONTRAST   TECHNIQUE: Multiplanar, multisequence MR imaging of the lumbar spine was performed. No intravenous contrast was administered.   COMPARISON:  None  available.   FINDINGS: Segmentation: Standard. Lowest well-formed disc space labeled the L5-S1 level.   Alignment: 3 mm retrolisthesis of L5 on S1. Straightening with mild reversal of the normal lumbar lordosis.   Vertebrae: Vertebral body height maintained without acute or chronic fracture. Bone marrow signal intensity within normal limits. No discrete or worrisome osseous lesions. Discogenic reactive endplate change present about the L5-S1 interspace. No other abnormal marrow edema.   Conus medullaris and cauda equina: Conus extends to the L1 level. Conus and cauda equina appear normal.   Paraspinal and other soft tissues: Unremarkable.   Disc levels:   No significant findings are seen through the L4-5 level.   L5-S1: Retrolisthesis. Diffuse disc bulge with disc desiccation and reactive endplate spurring. Superimposed moderate-sized right subarticular disc protrusion with slight inferior angulation. Protruding disc contacts and displaces the descending right S1 nerve root in the right lateral recess. Moderate right lateral recess stenosis. Central canal remains patent. Mild to moderate bilateral L5 foraminal stenosis at this level.   IMPRESSION: 1. Moderate-sized right subarticular disc protrusion at L5-S1, contacting and displacing the descending right S1 nerve root. 2. Mild to moderate bilateral L5 foraminal stenosis related to disc bulge and reactive endplate change.     Electronically Signed   By: Rise Mu M.D.   On: 06/30/2022 05:12  Imaging: No results found.   PMFS History:  Patient Active Problem List   Diagnosis Date Noted   Acute right-sided low back pain with right-sided sciatica 03/29/2022   Past Medical History:  Diagnosis Date   Hypertension     Family History  Problem Relation Age of Onset   Healthy Mother    Healthy Father    Healthy Sister    Healthy Brother    Healthy Sister     Past Surgical History:  Procedure  Laterality Date   arm surgery  2012   left arm fracture repair   Social History   Occupational History   Not on file  Tobacco Use   Smoking status: Never   Smokeless tobacco: Never  Vaping Use   Vaping Use: Never used  Substance and Sexual Activity   Alcohol use: Not Currently   Drug use: No   Sexual activity: Not on file

## 2022-08-28 ENCOUNTER — Ambulatory Visit: Payer: Commercial Managed Care - HMO

## 2022-09-04 ENCOUNTER — Ambulatory Visit: Payer: Commercial Managed Care - HMO | Attending: Physical Medicine & Rehabilitation

## 2022-09-04 DIAGNOSIS — M6281 Muscle weakness (generalized): Secondary | ICD-10-CM | POA: Diagnosis present

## 2022-09-04 DIAGNOSIS — M5459 Other low back pain: Secondary | ICD-10-CM | POA: Diagnosis present

## 2022-09-04 NOTE — Therapy (Signed)
OUTPATIENT PHYSICAL THERAPY TREATMENT NOTE   Patient Name: Jaime Barton MRN: [id number] DOB:05-May-1981, 41 y.o., male Today's Date: 09/04/2022  PCP: Jilda Panda, MD REFERRING PROVIDER: Jennye Boroughs, MD  END OF SESSION:   PT End of Session - 09/04/22 1749     Visit Number 9    Number of Visits 17    Date for PT Re-Evaluation 09/11/22    Authorization Type [employer]    PT Start Time 1749    PT Stop Time 1827    PT Time Calculation (min) 38 min    Activity Tolerance Patient tolerated treatment well    Behavior During Therapy [facility] for tasks assessed/performed              Past Medical History:  Diagnosis Date   Hypertension    Past Surgical History:  Procedure Laterality Date   arm surgery  2012   left arm fracture repair   Patient Active Problem List   Diagnosis Date Noted   Acute right-sided low back pain with right-sided sciatica 03/29/2022    REFERRING DIAG: M54.17 (ICD-10-CM) - Lumbosacral radiculopathy at S1  THERAPY DIAG:  Other low back pain  Muscle weakness (generalized)  Rationale for Evaluation and Treatment Rehabilitation  PERTINENT HISTORY: HTN   SUBJECTIVE: Pt presents to PT with continued lower back pain. Has been compliant with HEP with no adverse effect. Had a f/u visit with MD recently, has not had too much change in pain. He is ready to begin PT at this time.   PAIN:  Are you having pain?  Yes: NPRS scale: 6/10 Pain location: R lower back, R gluteal, R LE to foot Aggravating factors: prolonged standing, walking Relieving factors: some exercise   OBJECTIVE: (objective measures completed at initial evaluation unless otherwise dated)  PATIENT SURVEYS:   FOTO: 47% function; 60% predicted   COGNITION:           Overall cognitive status: Within functional limits for tasks assessed                          SENSATION: WFL   POSTURE:            Medium body habitus; slumped sitting posture, decreased lumbar lordosis    PALPATION: TTP to R lumbar paraspinals, R gluteals   LUMBAR ROM:    Active  A/PROM  eval  Flexion WFL p!  EPxtension WFL p!  Right lateral flexion WFL  Left lateral flexion WFL  Right rotation WFL  Left rotation WFL   (Blank rows = not tested)   LOWER EXTREMITY MMT:     MMT Right 07/17/2022  Left 07/17/2022   Hip flexion       Hip extension      Hip abduction      Hip adduction      Hip external rotation      Hip internal rotation      Knee extension      Knee flexion      Ankle dorsiflexion       Ankle plantarflexion      Ankle inversion      Ankle eversion      Grossly WNL WNL  (Blank rows = not tested)   LUMBAR SPECIAL TESTS:  Straight leg raise test: Positive and Slump test: Positive   FUNCTIONAL TESTS:  30 Second Sit to Stand: 16 reps POE: No change in peripheral symptoms   GAIT: Distance walked: 49f Assistive device utilized:  None Level of assistance: Complete Independence Comments: slight antalgic gait R   TODAY'S TREATMENT  [facility] Adult PT Treatment:                                                DATE: 09/04/2022 Therapeutic Exercise: LTR x 10  Bridge with ER GTB 2x15 S/L clamshell x 15 each GTB Supine 90/90 isometric hold 2x30" Deadbug 2x10 POE x 60" Prone press up 2x10 Bird dog 2x10  Modified side plank 2x20" each Paloff press 2x10 13# Standing chop x 10 13# Standing hip abd/ext 2x10 25# each Modified thomas stretch EOM x 60" each Leg press 2x10 85# R piriformis stretch fig 4 2x30"  Seated sciatic nerve glides Rt 2x15  [facility] Adult PT Treatment:                                                DATE: 08/21/2022 Therapeutic Exercise: Nustep L6 x 5 mins Palloff press 10# 2x10 BIL Prone prop 2 min Supine 90/90 isometric hold 3x30" Supine 90/90 with alternating heel taps 2x10 BIL Modified thomas stretch EOM Rt x1' with strap R piriformis stretch fig 4 2x30" holding behind L thigh Single leg bridge B 15x SLR B 15x Bridge with ball 15x Bridge  with clam GTB 15x BIL clams 15x GTB Seated hamstring stretch R 30s x3 Sidelying open books x10 BIL LTR x 10 BIL Seated sciatic nerve glides Rt x20  [facility] Adult PT Treatment:                                                DATE: 08/20/22 Therapeutic Exercise: Nustep L6 x 8 mins Prone prop 2 min Prone press 2x10 with OP Supine 90/90 isometric hold 3x30" Modified thomas stretch EOM Rt x1' with strap R piriformis stretch fig 4 2x30" w/towel Single leg bridge B 15x SLR B 15x Abduction R 15x2 Single leg bridge 15/15 Bridge with ball 15x Bridge against GTB 15x R clams 15x2 Seated hamstring stretch R 30s x3 Sidelying open books x10 BIL LTR x 10 BIL    PATIENT EDUCATION:  Education details: eval findings, FOTO, HEP, POC Person educated: Patient Education method: Explanation, Demonstration, and Handouts Education comprehension: verbalized understanding and returned demonstration     HOME EXERCISE PROGRAM: Access Code: [email or web address] URL: [email or web address] Date: 07/17/2022 Prepared by: Octavio Manns   Exercises - Seated Sciatic Tensioner  - 3 x daily - 7 x weekly - 3 sets - 10 reps - Supine Posterior Pelvic Tilt  - 1 x daily - 7 x weekly - 2 sets - 10 reps - 5 sec hold - Supine Bridge  - 1 x daily - 7 x weekly - 3 sets - 10 reps - 3 sec hold - Hooklying Hamstring Stretch with Strap  - 1 x daily - 7 x weekly - 2-3 reps - 30 sec hold - Supine Piriformis Stretch with Leg Straight  - 1 x daily - 7 x weekly - 2-3 reps - 30 sec hold - Seated Figure 4 Piriformis Stretch  - 1 x daily - 7 x weekly - 2-3  reps - 30 sec hold   ASSESSMENT:   CLINICAL IMPRESSION: Pt was able to complete all prescribed exercises with no adverse effect. Noted slight decrease in pain post session. Therapy focused on improving core and proximal hip muscle strength in order to decrease pain and improve comfort. Will continue to progress as able per POC.     OBJECTIVE IMPAIRMENTS decreased activity  tolerance, decreased mobility, decreased strength, and pain    ACTIVITY LIMITATIONS carrying, lifting, bending, standing, squatting, stairs, and transfers   PARTICIPATION LIMITATIONS: community activity, occupation, and yard work   PERSONAL FACTORS Time since onset of injury/illness/exacerbation and 1 comorbidity: HTN  are also affecting patient's functional outcome.        GOALS: Goals reviewed with patient? No   SHORT TERM GOALS: Target date: 08/07/2022   Pt will be compliant and knowledgeable with initial HEP for improved comfort and carryover Baseline: initial HEP given  Goal status: MET Pt reports adherence 08/08/22   2.  Pt will self report lower back and R LE pain no greater than 6/10 for improved comfort and functional ability Baseline: 10/10 at worst Goal status: MET Pt reports 4/10 on 08/08/22   LONG TERM GOALS: Target date: 09/11/2022   Pt will improve FOTO function score to no less than 60% as proxy for functional improvement Baseline: 47% function; 08/13/22 47% Goal status: INITIAL   2.  Pt will self report lower back and R LE pain no greater than 1-2/10 for improved comfort and functional ability Baseline: 10/10 at worst Goal status: INITIAL   3.  Pt will self report improved standing tolerance to >2 hours without increase in pain at work for improved comfort and function Baseline: <1hr Goal status: INITIAL   PLAN: PT FREQUENCY: 1-2x/week   PT DURATION: 8 weeks   PLANNED INTERVENTIONS: Therapeutic exercises, Therapeutic activity, Neuromuscular re-education, Balance training, Gait training, Patient/Family education, Self Care, Joint mobilization, Dry Needling, Electrical stimulation, Cryotherapy, Moist heat, Vasopneumatic device, Manual therapy, and Re-evaluation.   PLAN FOR NEXT SESSION: assess HEP response, progress neutral spine core strength, TPDN to lumbar paraspinals and R gluteals, extension based activities  Ward Chatters PT  09/04/22 6:27 PM

## 2022-09-05 ENCOUNTER — Ambulatory Visit: Payer: Commercial Managed Care - HMO

## 2022-09-05 DIAGNOSIS — M5459 Other low back pain: Secondary | ICD-10-CM

## 2022-09-05 DIAGNOSIS — M6281 Muscle weakness (generalized): Secondary | ICD-10-CM

## 2022-09-05 NOTE — Therapy (Addendum)
OUTPATIENT PHYSICAL THERAPY TREATMENT NOTE/DISCHARGE  PHYSICAL THERAPY DISCHARGE SUMMARY  Visits from Start of Care: 10  Current functional level related to goals / functional outcomes: See goals and objectives   Remaining deficits: See goals and objectives   Education / Equipment: HEP   Patient agrees to discharge. Patient goals were See goals and objectives. Patient is being discharged due to not returning since the last visit.   Patient Name: Jaime Barton MRN: [id number] DOB:[birth year], 41 y.o., male Today's Date: 09/05/2022  PCP: Jilda Panda, MD REFERRING PROVIDER: Jennye Boroughs, MD  END OF SESSION:   PT End of Session - 09/05/22 1745     Visit Number 10    Number of Visits 17    Date for PT Re-Evaluation 09/11/22    Authorization Type [employer]    PT Start Time 1745    PT Stop Time 1825    PT Time Calculation (min) 40 min    Activity Tolerance Patient tolerated treatment well    Behavior During Therapy [facility] for tasks assessed/performed               Past Medical History:  Diagnosis Date   Hypertension    Past Surgical History:  Procedure Laterality Date   arm surgery  2012   left arm fracture repair   Patient Active Problem List   Diagnosis Date Noted   Acute right-sided low back pain with right-sided sciatica 03/29/2022    REFERRING DIAG: M54.17 (ICD-10-CM) - Lumbosacral radiculopathy at S1  THERAPY DIAG:  Other low back pain  Muscle weakness (generalized)  Rationale for Evaluation and Treatment Rehabilitation  PERTINENT HISTORY: HTN   SUBJECTIVE: Pt presents to PT with reports of decreased pain, does note he has been taking medicine. Pt is ready to begin PT at this time.   PAIN:  Are you having pain?  Yes: NPRS scale: 6/10 Pain location: R lower back, R gluteal, R LE to foot Aggravating factors: prolonged standing, walking Relieving factors: some exercise   OBJECTIVE: (objective measures completed at initial evaluation  unless otherwise dated)  PATIENT SURVEYS:   FOTO: 47% function; 60% predicted   COGNITION:           Overall cognitive status: Within functional limits for tasks assessed                          SENSATION: WFL   POSTURE:            Medium body habitus; slumped sitting posture, decreased lumbar lordosis   PALPATION: TTP to R lumbar paraspinals, R gluteals   LUMBAR ROM:    Active  A/PROM  eval  Flexion WFL p!  EPxtension WFL p!  Right lateral flexion WFL  Left lateral flexion WFL  Right rotation [facility]  Left rotation WFL   (Blank rows = not tested)   LOWER EXTREMITY MMT:     MMT Right 07/17/2022  Left 07/17/2022   Hip flexion       Hip extension      Hip abduction      Hip adduction      Hip external rotation      Hip internal rotation      Knee extension      Knee flexion      Ankle dorsiflexion       Ankle plantarflexion      Ankle inversion      Ankle eversion      Grossly WNL  WNL  (Blank rows = not tested)   LUMBAR SPECIAL TESTS:  Straight leg raise test: Positive and Slump test: Positive   FUNCTIONAL TESTS:  30 Second Sit to Stand: 16 reps POE: No change in peripheral symptoms   GAIT: Distance walked: 65f Assistive device utilized: None Level of assistance: Complete Independence Comments: slight antalgic gait R   TODAY'S TREATMENT  [facility] Adult PT Treatment:                                                DATE: 09/05/2022 Therapeutic Exercise: NuStep lvl 6 UE/LE x 3 min while taking subjective STS 15# KB 2x10  LTR x 10  Bridge with ER GTB 2x20 S/L clamshell 2x15 each GTB Supine 90/90 isometric hold 2x30" Deadbug 2x10 POE x 60" Prone hip extension 2x10 each Prone press up 2x10 Bird dog 2x10  Modified side plank 2x20" each Paloff press 2x10 13# Half kneeling chop x 10 13# Standing hip abd/ext 2x10 25# each Deadlift small bar 3x10 Deadlift to row 3x5  [facility] Adult PT Treatment:                                                DATE:  09/04/2022 Therapeutic Exercise: LTR x 10  Bridge with ER GTB 2x15 S/L clamshell x 15 each GTB Supine 90/90 isometric hold 2x30" Deadbug 2x10 POE x 60" Prone press up 2x10 Bird dog 2x10  Modified side plank 2x20" each Paloff press 2x10 13# Standing chop x 10 13# Standing hip abd/ext 2x10 25# each Modified thomas stretch EOM x 60" each Leg press 2x10 85# R piriformis stretch fig 4 2x30"  Seated sciatic nerve glides Rt 2x15  [facility] Adult PT Treatment:                                                DATE: 08/21/2022 Therapeutic Exercise: Nustep L6 x 5 mins Palloff press 10# 2x10 BIL Prone prop 2 min Supine 90/90 isometric hold 3x30" Supine 90/90 with alternating heel taps 2x10 BIL Modified thomas stretch EOM Rt x1' with strap R piriformis stretch fig 4 2x30" holding behind L thigh Single leg bridge B 15x SLR B 15x Bridge with ball 15x Bridge with clam GTB 15x BIL clams 15x GTB Seated hamstring stretch R 30s x3 Sidelying open books x10 BIL LTR x 10 BIL Seated sciatic nerve glides Rt x20  [facility] Adult PT Treatment:                                                DATE: 08/20/22 Therapeutic Exercise: Nustep L6 x 8 mins Prone prop 2 min Prone press 2x10 with OP Supine 90/90 isometric hold 3x30" Modified thomas stretch EOM Rt x1' with strap R piriformis stretch fig 4 2x30" w/towel Single leg bridge B 15x SLR B 15x Abduction R 15x2 Single leg bridge 15/15 Bridge with ball 15x Bridge against GTB 15x R clams 15x2 Seated hamstring stretch R 30s x3  Sidelying open books x10 BIL LTR x 10 BIL    PATIENT EDUCATION:  Education details: eval findings, FOTO, HEP, POC Person educated: Patient Education method: Explanation, Demonstration, and Handouts Education comprehension: verbalized understanding and returned demonstration     HOME EXERCISE PROGRAM: Access Code: [email or web address] URL: [email or web address] Date: 07/17/2022 Prepared by: Octavio Manns   Exercises - Seated  Sciatic Tensioner  - 3 x daily - 7 x weekly - 3 sets - 10 reps - Supine Posterior Pelvic Tilt  - 1 x daily - 7 x weekly - 2 sets - 10 reps - 5 sec hold - Supine Bridge  - 1 x daily - 7 x weekly - 3 sets - 10 reps - 3 sec hold - Hooklying Hamstring Stretch with Strap  - 1 x daily - 7 x weekly - 2-3 reps - 30 sec hold - Supine Piriformis Stretch with Leg Straight  - 1 x daily - 7 x weekly - 2-3 reps - 30 sec hold - Seated Figure 4 Piriformis Stretch  - 1 x daily - 7 x weekly - 2-3 reps - 30 sec hold   ASSESSMENT:   CLINICAL IMPRESSION: Pt was able to complete all prescribed exercises with no adverse effect. Noted slight decrease in pain post session to 3/10. Therapy focused on improving core and proximal hip muscle strength in order to decrease pain and improve comfort. Will continue to progress as able per POC.     OBJECTIVE IMPAIRMENTS decreased activity tolerance, decreased mobility, decreased strength, and pain    ACTIVITY LIMITATIONS carrying, lifting, bending, standing, squatting, stairs, and transfers   PARTICIPATION LIMITATIONS: community activity, occupation, and yard work   PERSONAL FACTORS Time since onset of injury/illness/exacerbation and 1 comorbidity: HTN  are also affecting patient's functional outcome.        GOALS: Goals reviewed with patient? No   SHORT TERM GOALS: Target date: 08/07/2022   Pt will be compliant and knowledgeable with initial HEP for improved comfort and carryover Baseline: initial HEP given  Goal status: MET Pt reports adherence 08/08/22   2.  Pt will self report lower back and R LE pain no greater than 6/10 for improved comfort and functional ability Baseline: 10/10 at worst Goal status: MET Pt reports 4/10 on 08/08/22   LONG TERM GOALS: Target date: 09/11/2022   Pt will improve FOTO function score to no less than 60% as proxy for functional improvement Baseline: 47% function; 08/13/22 47% Goal status: INITIAL   2.  Pt will self report lower  back and R LE pain no greater than 1-2/10 for improved comfort and functional ability Baseline: 10/10 at worst Goal status: INITIAL   3.  Pt will self report improved standing tolerance to >2 hours without increase in pain at work for improved comfort and function Baseline: <1hr Goal status: INITIAL   PLAN: PT FREQUENCY: 1-2x/week   PT DURATION: 8 weeks   PLANNED INTERVENTIONS: Therapeutic exercises, Therapeutic activity, Neuromuscular re-education, Balance training, Gait training, Patient/Family education, Self Care, Joint mobilization, Dry Needling, Electrical stimulation, Cryotherapy, Moist heat, Vasopneumatic device, Manual therapy, and Re-evaluation.   PLAN FOR NEXT SESSION: assess HEP response, progress neutral spine core strength, TPDN to lumbar paraspinals and R gluteals, extension based activities  Ward Chatters PT  09/05/22 6:25 PM

## 2022-09-06 ENCOUNTER — Encounter
Payer: Commercial Managed Care - HMO | Attending: Physical Medicine & Rehabilitation | Admitting: Physical Medicine & Rehabilitation

## 2022-09-06 DIAGNOSIS — M5417 Radiculopathy, lumbosacral region: Secondary | ICD-10-CM | POA: Insufficient documentation

## 2022-09-06 DIAGNOSIS — M48061 Spinal stenosis, lumbar region without neurogenic claudication: Secondary | ICD-10-CM | POA: Insufficient documentation

## 2022-09-06 DIAGNOSIS — M5136 Other intervertebral disc degeneration, lumbar region: Secondary | ICD-10-CM | POA: Insufficient documentation

## 2022-09-11 ENCOUNTER — Ambulatory Visit: Payer: Commercial Managed Care - HMO

## 2022-09-12 ENCOUNTER — Telehealth: Payer: Self-pay | Admitting: Orthopaedic Surgery

## 2022-09-12 ENCOUNTER — Other Ambulatory Visit: Payer: Self-pay | Admitting: Physician Assistant

## 2022-09-12 ENCOUNTER — Other Ambulatory Visit: Payer: Self-pay | Admitting: Orthopaedic Surgery

## 2022-09-12 ENCOUNTER — Ambulatory Visit: Payer: Commercial Managed Care - HMO

## 2022-09-12 MED ORDER — DICLOFENAC SODIUM 75 MG PO TBEC: 75.0000 mg | DELAYED_RELEASE_TABLET | Freq: Two times a day (BID) | ORAL | 2 refills | Status: DC

## 2022-09-12 NOTE — Telephone Encounter (Signed)
Sent in

## 2022-09-12 NOTE — Telephone Encounter (Signed)
Patient called needing Rx refilled Diclofenac. The number to contact patient is [phone]

## 2022-09-17 ENCOUNTER — Ambulatory Visit: Payer: Commercial Managed Care - HMO

## 2022-09-19 ENCOUNTER — Ambulatory Visit: Payer: Commercial Managed Care - HMO

## 2022-09-20 ENCOUNTER — Ambulatory Visit: Payer: Commercial Managed Care - HMO | Admitting: Orthopaedic Surgery

## 2022-10-25 ENCOUNTER — Ambulatory Visit (INDEPENDENT_AMBULATORY_CARE_PROVIDER_SITE_OTHER): Payer: Commercial Managed Care - HMO | Admitting: Orthopaedic Surgery

## 2022-10-25 ENCOUNTER — Encounter: Payer: Self-pay | Admitting: Orthopaedic Surgery

## 2022-10-25 VITALS — BP 169/110 | HR 66 | Ht 70.0 in | Wt 180.0 lb

## 2022-10-25 DIAGNOSIS — M5441 Lumbago with sciatica, right side: Secondary | ICD-10-CM | POA: Diagnosis not present

## 2022-10-25 DIAGNOSIS — M5126 Other intervertebral disc displacement, lumbar region: Secondary | ICD-10-CM

## 2022-10-25 NOTE — Progress Notes (Signed)
Office Visit Note   Patient: Jaime Barton           Date of Birth: 08-Mar-1981           MRN: [id number] Visit Date: 10/25/2022              Requested by: Ralene Ok, MD [address] [place],  [place] [address] PCP: Ralene Ok, MD   Assessment & Plan: Visit Diagnoses:  1. Acute right-sided low back pain with right-sided sciatica   2. Protrusion of lumbar intervertebral disc            Right L5-S1.    Plan: Patient and 10 months history of back and right leg pain with moderately large L5-S1 disc herniation with right S1 nerve root displacement.  He has had maximum conservative treatment with medications as well as epidural steroid.  Plan of the microdiscectomy overnight stay in the hospital 6 weeks convalescence with a walking program.  Discussed him and do not think he needs formal physical therapy.  Questions elicited and answered he understands would like to proceed.  Follow-Up Instructions: No follow-ups on file.   Orders:  No orders of the defined types were placed in this encounter.  No orders of the defined types were placed in this encounter.     Procedures: No procedures performed   Clinical Data: No additional findings.   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  He has had numbness tingling and weakness in his leg but is still continued to work.  He has 4 children the youngest is age 42.  MRI scan showed trace retrolisthesis with slight inpatient bulging and moderate to large right/subarticular disc protrusion with displacement of the S1 nerve root.  No bowel bladder symptoms fever chills.  He has been treated with anti-inflammatories Neurontin, hydrocodone, Robaxin, prednisone Dosepak, tramadol and then epidural steroid injection 08/05/2022 which helped his pain from a 9 down to 5 but with persistent symptoms.  Dr. Glee Arvin refer the  patient to me for surgical consideration.  Review of Systems positive for hypertension he states he is skipped taking his medicine for couple days.  Negative for chest pain stroke rheumatologic conditions.  He drinks about every other day usually 3-4 drinks.   Objective: Vital Signs: BP (!) 169/110   Pulse 66   Ht 5\' 10"  (1.778 m)   Wt 180 lb (81.6 kg)   BMI 25.83 kg/m   Physical Exam Constitutional:      Appearance: He is well-developed.  HENT:     Head: Normocephalic and atraumatic.     Right Ear: External ear normal.     Left Ear: External ear normal.  Eyes:     Pupils: Pupils are equal, round, and reactive to light.  Neck:     Thyroid: No thyromegaly.     Trachea: No tracheal deviation.  Cardiovascular:     Rate and Rhythm: Normal rate.  Pulmonary:     Effort: Pulmonary effort is normal.     Breath sounds: No wheezing.  Abdominal:     General: Bowel sounds are normal.     Palpations: Abdomen is soft.  Musculoskeletal:     Cervical back: Neck supple.  Skin:    General: Skin is warm and dry.     Capillary Refill: Capillary refill takes less than 2 seconds.  Neurological:  Mental Status: He is alert and oriented to person, place, and time.  Psychiatric:        Behavior: Behavior normal.        Thought Content: Thought content normal.        Judgment: Judgment normal.     Ortho Exam po at 60 degrees positive popliteal compression test.  He fatigues with toe raises on the right no problems with the left.  He is able to heel walk and can toe walk across the room.  1 grade peroneal weakness on the right normal on the left.  Sitive for leg raising on the right  Specialty Comments:  MRI LUMBAR SPINE WITHOUT CONTRAST   TECHNIQUE: Multiplanar, multisequence MR imaging of the lumbar spine was performed. No intravenous contrast was administered.   COMPARISON:  None available.   FINDINGS: Segmentation: Standard. Lowest well-formed disc space labeled the L5-S1  level.   Alignment: 3 mm retrolisthesis of L5 on S1. Straightening with mild reversal of the normal lumbar lordosis.   Vertebrae: Vertebral body height maintained without acute or chronic fracture. Bone marrow signal intensity within normal limits. No discrete or worrisome osseous lesions. Discogenic reactive endplate change present about the L5-S1 interspace. No other abnormal marrow edema.   Conus medullaris and cauda equina: Conus extends to the L1 level. Conus and cauda equina appear normal.   Paraspinal and other soft tissues: Unremarkable.   Disc levels:   No significant findings are seen through the L4-5 level.   L5-S1: Retrolisthesis. Diffuse disc bulge with disc desiccation and reactive endplate spurring. Superimposed moderate-sized right subarticular disc protrusion with slight inferior angulation. Protruding disc contacts and displaces the descending right S1 nerve root in the right lateral recess. Moderate right lateral recess stenosis. Central canal remains patent. Mild to moderate bilateral L5 foraminal stenosis at this level.   IMPRESSION: 1. Moderate-sized right subarticular disc protrusion at L5-S1, contacting and displacing the descending right S1 nerve root. 2. Mild to moderate bilateral L5 foraminal stenosis related to disc bulge and reactive endplate change.     Electronically Signed   By: Jeannine Boga M.D.   On: 06/30/2022 05:12  Imaging: No results found.   PMFS History: Patient Active Problem List   Diagnosis Date Noted   Protrusion of lumbar intervertebral disc 10/25/2022   Acute right-sided low back pain with right-sided sciatica 03/29/2022   Past Medical History:  Diagnosis Date   Hypertension     Family History  Problem Relation Age of Onset   Healthy Mother    Healthy Father    Healthy Sister    Healthy Brother    Healthy Sister     Past Surgical History:  Procedure Laterality Date   arm surgery  2012   left arm  fracture repair   Social History   Occupational History   Not on file  Tobacco Use   Smoking status: Never   Smokeless tobacco: Never  Vaping Use   Vaping Use: Never used  Substance and Sexual Activity   Alcohol use: Not Currently   Drug use: No   Sexual activity: Not on file

## 2022-12-02 ENCOUNTER — Ambulatory Visit (INDEPENDENT_AMBULATORY_CARE_PROVIDER_SITE_OTHER): Payer: Commercial Managed Care - HMO

## 2022-12-02 ENCOUNTER — Encounter (HOSPITAL_COMMUNITY): Payer: Self-pay | Admitting: Emergency Medicine

## 2022-12-02 ENCOUNTER — Ambulatory Visit (HOSPITAL_COMMUNITY)
Admission: EM | Admit: 2022-12-02 | Discharge: 2022-12-02 | Disposition: A | Discharge status: Home / Self Care | Payer: Commercial Managed Care - HMO | Attending: Nurse Practitioner | Admitting: Nurse Practitioner

## 2022-12-02 DIAGNOSIS — S161XXA Strain of muscle, fascia and tendon at neck level, initial encounter: Secondary | ICD-10-CM

## 2022-12-02 DIAGNOSIS — M5441 Lumbago with sciatica, right side: Secondary | ICD-10-CM

## 2022-12-02 DIAGNOSIS — M545 Low back pain, unspecified: Secondary | ICD-10-CM | POA: Diagnosis not present

## 2022-12-02 MED ORDER — CYCLOBENZAPRINE HCL 10 MG PO TABS: 10.0000 mg | ORAL_TABLET | Freq: Three times a day (TID) | ORAL | 0 refills | Status: DC | PRN

## 2022-12-02 MED ORDER — PREDNISONE 10 MG (21) PO TBPK: ORAL_TABLET | Freq: Every day | ORAL | 0 refills | Status: DC
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
## 2022-12-02 NOTE — ED Notes (Signed)
Notified provider of patient condition, will seat patient in waiting area until room is available.
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
## 2022-12-02 NOTE — Discharge Instructions (Signed)
Flexeril as needed.  Please note this medication can make you drowsy.  Do not drink alcohol or drive on this medication Prednisone as prescribed Heat to the affected areas as needed Rest Follow-up with your PCP 2 to 3 days for recheck Please go to the ER for any worsening symptoms I hope you feel better soon!

## 2022-12-26 ENCOUNTER — Emergency Department (HOSPITAL_COMMUNITY)
Admission: EM | Admit: 2022-12-26 | Discharge: 2022-12-26 | Discharge status: Against Medical Advice | Payer: Commercial Managed Care - HMO

## 2022-12-26 NOTE — ED Notes (Addendum)
Pt called 3x no answer  

## 2023-02-04 ENCOUNTER — Ambulatory Visit (HOSPITAL_COMMUNITY)
Admission: EM | Admit: 2023-02-04 | Discharge: 2023-02-04 | Disposition: A | Discharge status: Home / Self Care | Payer: Commercial Managed Care - HMO | Attending: Physician Assistant | Admitting: Physician Assistant

## 2023-02-04 ENCOUNTER — Encounter (HOSPITAL_COMMUNITY): Payer: Self-pay | Admitting: Emergency Medicine

## 2023-02-04 DIAGNOSIS — K2920 Alcoholic gastritis without bleeding: Secondary | ICD-10-CM | POA: Diagnosis present

## 2023-02-04 DIAGNOSIS — R1012 Left upper quadrant pain: Secondary | ICD-10-CM | POA: Diagnosis present

## 2023-02-04 DIAGNOSIS — I10 Essential (primary) hypertension: Secondary | ICD-10-CM | POA: Diagnosis not present

## 2023-02-04 HISTORY — DX: Gout, unspecified: M10.9

## 2023-02-04 LAB — CBC WITH DIFFERENTIAL/PLATELET
Abs Immature Granulocytes: 0.01 10*3/uL (ref 0.00–0.07)
Basophils Absolute: 0 10*3/uL (ref 0.0–0.1)
Basophils Relative: 1 %
Eosinophils Absolute: 0.1 10*3/uL (ref 0.0–0.5)
Eosinophils Relative: 2 %
HCT: 48.9 % (ref 39.0–52.0)
Hemoglobin: 16.9 g/dL (ref 13.0–17.0)
Immature Granulocytes: 0 %
Lymphocytes Relative: 29 %
Lymphs Abs: 1.2 10*3/uL (ref 0.7–4.0)
MCH: 29.3 pg (ref 26.0–34.0)
MCHC: 34.6 g/dL (ref 30.0–36.0)
MCV: 84.7 fL (ref 80.0–100.0)
Monocytes Absolute: 0.7 10*3/uL (ref 0.1–1.0)
Monocytes Relative: 17 %
Neutro Abs: 2.1 10*3/uL (ref 1.7–7.7)
Neutrophils Relative %: 51 %
Platelets: 225 10*3/uL (ref 150–400)
RBC: 5.77 MIL/uL (ref 4.22–5.81)
RDW: 12.4 % (ref 11.5–15.5)
WBC: 4.2 10*3/uL (ref 4.0–10.5)
nRBC: 0 % (ref 0.0–0.2)

## 2023-02-04 LAB — COMPREHENSIVE METABOLIC PANEL
ALT: 26 U/L (ref 0–44)
AST: 40 U/L (ref 15–41)
Albumin: 4 g/dL (ref 3.5–5.0)
Alkaline Phosphatase: 33 U/L — ABNORMAL LOW (ref 38–126)
Anion gap: 11 (ref 5–15)
BUN: 8 mg/dL (ref 6–20)
CO2: 27 mmol/L (ref 22–32)
Calcium: 9.4 mg/dL (ref 8.9–10.3)
Chloride: 98 mmol/L (ref 98–111)
Creatinine, Ser: 0.88 mg/dL (ref 0.61–1.24)
GFR, Estimated: >60 mL/min (ref >60)
Glucose, Bld: 115 mg/dL — ABNORMAL HIGH (ref 70–99)
Potassium: 4.5 mmol/L (ref 3.5–5.1)
Sodium: 136 mmol/L (ref 135–145)
Total Bilirubin: 1.9 mg/dL — ABNORMAL HIGH (ref 0.3–1.2)
Total Protein: 7.1 g/dL (ref 6.5–8.1)

## 2023-02-04 LAB — LIPASE, BLOOD: Lipase: 36 U/L (ref 11–51)

## 2023-02-04 MED ORDER — SUCRALFATE 1 G PO TABS: 1.0000 g | ORAL_TABLET | Freq: Three times a day (TID) | ORAL | 0 refills | Status: DC

## 2023-02-04 MED ORDER — PANTOPRAZOLE SODIUM 40 MG PO TBEC: 40.0000 mg | DELAYED_RELEASE_TABLET | Freq: Every day | ORAL | 0 refills | Status: DC

## 2023-02-04 MED ORDER — ALUM & MAG HYDROXIDE-SIMETH 200-200-20 MG/5ML PO SUSP
30.0000 mL | Freq: Once | ORAL | Status: AC
  Administered 2023-02-04: 30 mL via ORAL

## 2023-02-04 MED ORDER — ALUM & MAG HYDROXIDE-SIMETH 200-200-20 MG/5ML PO SUSP
ORAL | Status: AC
  Filled 2023-02-04: qty 30

## 2023-02-04 NOTE — Discharge Instructions (Signed)
I am glad that you are starting to feel better.  I believe that you have irritation of your stomach lining from the alcohol use.  Please continue to abstain from alcohol.  Take Carafate before each meal and before bed for the next week.  Take Protonix on an empty stomach (30 minutes before meal or 2 hours after) for the next 14 days.  Avoid alcohol as well as NSAIDs (aspirin, ibuprofen/Advil, naproxen/Aleve).  Follow-up with your primary care.  We will contact you if your lab work is abnormal.  If you have any severe abdominal pain, blood in your stool, dark black stools, nausea/vomiting, fever you need to be seen immediately.  Your blood pressure is elevated.  Take your medication when you get home.  Monitor this at home.  Follow-up with your primary care within a week.  If you develop any chest pain, shortness of breath, headache, vision change, dizziness in setting of high blood pressure you need to go to the emergency room.

## 2023-02-04 NOTE — ED Triage Notes (Signed)
Pt reports on Saturday had LUQ pains. Unsure if related to drinking too much does drink daily (about pint daily). Reports that pain getting better each day. Pain worse with laying down and moving. Denies n/v, urinary or bowel problems.  Denies taking any medications for pain  Reports that scared because people telling him his eyes looked yellow.

## 2023-02-04 NOTE — ED Provider Notes (Signed)
Jaime Barton    CSN: [id number] Arrival date & time: 02/04/23  0919      History   Chief Complaint Chief Complaint  Patient presents with   Abdominal Pain    HPI Jaime Barton is a 42 y.o. male.   Patient presents today with a 4-day history of left upper quadrant abdominal pain.  Reports that he was drinking more heavily prior to symptom onset and has not had any alcohol in the past 4 days which has provided some improvement of symptoms.  Currently pain is rated 3 on a 0-10 pain scale, described as aching, localized to left upper quadrant, no aggravating or alleviating factors identified.  He is eating and drinking normally.  Denies regular NSAID use.  Denies any additional changes including suspicious food, known sick contacts, medication changes, recent antibiotics, recent travel.  He denies any urinary symptoms.  Denies any nausea, vomiting, diarrhea, melena, hematochezia, hematemesis.  He does report that people of told him that his eyes looked different which is one of the reasons that he came in today.  He denies history of liver disease or cirrhosis.  Denies history of peptic ulcer disease or GERD.  He has not tried any over-the-counter medication for symptom management.  Blood pressure is elevated today.  Patient is currently prescribed lisinopril and generally is compliant with his medication but has not yet had his dose today.  He denies any headache, dizziness, chest pain, shortness of breath, visual disturbance.  He does have a primary care provider that he follows with closely.  Denies any recent NSAID use, caffeine consumption, sodium consumption, decongestant use.    Past Medical History:  Diagnosis Date   Gout    Hypertension     Patient Active Problem List   Diagnosis Date Noted   Protrusion of lumbar intervertebral disc 10/25/2022   Acute right-sided low back pain with right-sided sciatica 03/29/2022    Past Surgical History:  Procedure Laterality  Date   arm surgery  12/30/2010   left arm fracture repair   HERNIA REPAIR         Home Medications    Prior to Admission medications   Medication Sig Start Date End Date Taking? Authorizing Provider  pantoprazole (PROTONIX) 40 MG tablet Take 1 tablet (40 mg total) by mouth daily. 02/04/23  Yes Corinthian Kemler K, PA-C  sucralfate (CARAFATE) 1 g tablet Take 1 tablet (1 g total) by mouth 4 (four) times daily -  with meals and at bedtime. 02/04/23  Yes Merlyn Conley K, PA-C  colchicine 0.6 MG tablet Take 0.6 mg by mouth 2 (two) times daily. prn 06/10/22   [provider]  lisinopril (PRINIVIL,ZESTRIL) 10 MG tablet Take 1 tablet (10 mg total) by mouth daily. 02/09/19 06/12/20  Fransico Meadow, PA-C  lisinopril (ZESTRIL) 20 MG tablet Take 20 mg by mouth daily. 01/24/22   [provider]    Family History Family History  Problem Relation Age of Onset   Healthy Mother    Healthy Father    Healthy Sister    Healthy Brother    Healthy Sister     Social History Social History   Tobacco Use   Smoking status: Never   Smokeless tobacco: Never  Vaping Use   Vaping Use: Never used  Substance Use Topics   Alcohol use: Yes   Drug use: No     Allergies   Penicillins   Review of Systems Review of Systems  Constitutional:  Positive  for activity change. Negative for appetite change, fatigue and fever.  Respiratory:  Negative for cough and shortness of breath.   Cardiovascular:  Negative for chest pain.  Gastrointestinal:  Positive for abdominal pain. Negative for blood in stool, constipation, diarrhea, nausea and vomiting.  Genitourinary:  Negative for dysuria, frequency and urgency.     Physical Exam Triage Vital Signs ED Triage Vitals  Enc Vitals Group     BP 02/04/23 1118 (!) 163/117     Pulse Rate 02/04/23 1118 70     Resp 02/04/23 1118 14     Temp 02/04/23 1118 97.6 F (36.4 C)     Temp Source 02/04/23 1118 Oral     SpO2 02/04/23 1118 98 %     Weight --       Height --      Head Circumference --      Peak Flow --      Pain Score 02/04/23 1115 4     Pain Loc --      Pain Edu? --      Excl. in GC? --    No data found.  Updated Vital Signs BP (!) 162/112 (BP Location: Right Arm)   Pulse 70   Temp 97.6 F (36.4 C) (Oral)   Resp 14   SpO2 98%   Visual Acuity Right Eye Distance:   Left Eye Distance:   Bilateral Distance:    Right Eye Near:   Left Eye Near:    Bilateral Near:     Physical Exam Vitals reviewed.  Constitutional:      General: He is awake.     Appearance: Normal appearance. He is well-developed. He is not ill-appearing.     Comments: Very pleasant male appears stated age in no acute distress sitting comfortably in exam room  HENT:     Head: Normocephalic and atraumatic.     Right Ear: Tympanic membrane, ear canal and external ear normal. Tympanic membrane is not erythematous or bulging.     Left Ear: Tympanic membrane, ear canal and external ear normal. Tympanic membrane is not erythematous or bulging.     Nose: Nose normal.     Mouth/Throat:     Pharynx: Uvula midline. No oropharyngeal exudate or posterior oropharyngeal erythema.  Cardiovascular:     Rate and Rhythm: Normal rate and regular rhythm.     Heart sounds: Normal heart sounds, S1 normal and S2 normal. No murmur heard. Pulmonary:     Effort: Pulmonary effort is normal.     Breath sounds: Normal breath sounds. No stridor. No wheezing, rhonchi or rales.     Comments: Clear to auscultation bilaterally Abdominal:     General: Bowel sounds are normal.     Palpations: Abdomen is soft.     Tenderness: There is no abdominal tenderness. There is no right CVA tenderness, left CVA tenderness, guarding or rebound.     Comments: Benign abdominal exam.  No significant tenderness to palpation.  No evidence of acute abdomen on physical exam.  Neurological:     Mental Status: He is alert.  Psychiatric:        Behavior: Behavior is cooperative.      UC Treatments  / Results  Labs (all labs ordered are listed, but only abnormal results are displayed) Labs Reviewed  COMPREHENSIVE METABOLIC PANEL  CBC WITH DIFFERENTIAL/PLATELET  LIPASE, BLOOD    EKG   Radiology No results found.  Procedures Procedures (including critical care time)  Medications Ordered in UC  Medications  alum & mag hydroxide-simeth (MAALOX/MYLANTA) 200-200-20 MG/5ML suspension 30 mL (30 mLs Oral Given 02/04/23 1136)    Initial Impression / Assessment and Plan / UC Course  I have reviewed the triage vital signs and the nursing notes.  Pertinent labs & imaging results that were available during my care of the patient were reviewed by me and considered in my medical decision making (see chart for details).     Patient is well-appearing, afebrile, nontoxic, nontachycardic.  Vital signs and physical exam are reassuring today; no indication for emergent evaluation or imaging.  Basic labs including CBC and CMP obtained today.  Discussed that if he has any abnormal findings including significant anemia, acute kidney injury, elevated liver functions he would need more emergent evaluation to which he expressed understanding.  Congratulated him on abstaining from alcohol for last several days and encouraged him to continue with this pattern; denies any concerning symptoms including seizures or DT.  He was given GI cocktail in clinic with minimal improvement of symptoms.  Recommended that he eat a bland diet and avoid spicy/acidic/fatty foods.  Will start Carafate before each meal and before bed for the next week as well as 2 weeks of proton pump inhibitor.  Recommended that he avoid alcohol as well as NSAIDs.  Encouraged him to follow closely with his primary care for reevaluation.  Discussed that if at any point he has worsening symptoms including worsening abdominal pain, melena, hematochezia, fever, nausea, vomiting he should be seen immediately.  Strict return precautions given.  Blood  pressure is elevated today.  Discussed the importance of compliance with antihypertensive therapy.  He was encouraged to avoid NSAIDs, caffeine, sodium, decongestants.  Recommended that he take his blood pressure medication when he gets home and monitor this at home.  He is to follow-up with his primary care within a week.  Discussed that if he develops any chest pain, shortness of breath, headache, vision change, dizziness in the setting of high blood pressure he needs to go to the emergency room immediately.  Strict return precautions given.  Final Clinical Impressions(s) / UC Diagnoses   Final diagnoses:  Acute alcoholic gastritis, presence of bleeding unspecified  LUQ abdominal pain  Elevated blood pressure reading in office with diagnosis of hypertension     Discharge Instructions      I am glad that you are starting to feel better.  I believe that you have irritation of your stomach lining from the alcohol use.  Please continue to abstain from alcohol.  Take Carafate before each meal and before bed for the next week.  Take Protonix on an empty stomach (30 minutes before meal or 2 hours after) for the next 14 days.  Avoid alcohol as well as NSAIDs (aspirin, ibuprofen/Advil, naproxen/Aleve).  Follow-up with your primary care.  We will contact you if your lab work is abnormal.  If you have any severe abdominal pain, blood in your stool, dark black stools, nausea/vomiting, fever you need to be seen immediately.  Your blood pressure is elevated.  Take your medication when you get home.  Monitor this at home.  Follow-up with your primary care within a week.  If you develop any chest pain, shortness of breath, headache, vision change, dizziness in setting of high blood pressure you need to go to the emergency room.     ED Prescriptions     Medication Sig Dispense Auth. Provider   sucralfate (CARAFATE) 1 g tablet Take 1 tablet (1 g total)  by mouth 4 (four) times daily -  with meals and at  bedtime. 28 tablet Jacob Cicero K, PA-C   pantoprazole (PROTONIX) 40 MG tablet Take 1 tablet (40 mg total) by mouth daily. 14 tablet Anuja Manka, Derry Skill, PA-C      PDMP not reviewed this encounter.   Terrilee Croak, PA-C 02/04/23 1205

## 2024-04-30 ENCOUNTER — Encounter (HOSPITAL_COMMUNITY): Payer: Self-pay | Admitting: Emergency Medicine

## 2024-04-30 ENCOUNTER — Ambulatory Visit (INDEPENDENT_AMBULATORY_CARE_PROVIDER_SITE_OTHER)

## 2024-04-30 ENCOUNTER — Ambulatory Visit (HOSPITAL_COMMUNITY)
Admission: EM | Admit: 2024-04-30 | Discharge: 2024-04-30 | Disposition: A | Discharge status: Home / Self Care | Attending: Physician Assistant | Admitting: Physician Assistant

## 2024-04-30 DIAGNOSIS — R052 Subacute cough: Secondary | ICD-10-CM

## 2024-04-30 DIAGNOSIS — J189 Pneumonia, unspecified organism: Secondary | ICD-10-CM | POA: Diagnosis not present

## 2024-04-30 DIAGNOSIS — R0602 Shortness of breath: Secondary | ICD-10-CM | POA: Diagnosis not present

## 2024-04-30 DIAGNOSIS — R062 Wheezing: Secondary | ICD-10-CM | POA: Diagnosis not present

## 2024-04-30 MED ORDER — IPRATROPIUM-ALBUTEROL 0.5-2.5 (3) MG/3ML IN SOLN
RESPIRATORY_TRACT | Status: AC
  Filled 2024-04-30: qty 3

## 2024-04-30 MED ORDER — DOXYCYCLINE HYCLATE 100 MG PO CAPS: 100.0000 mg | ORAL_CAPSULE | Freq: Two times a day (BID) | ORAL | 0 refills | Status: DC

## 2024-04-30 MED ORDER — METHYLPREDNISOLONE SODIUM SUCC 125 MG IJ SOLR
INTRAMUSCULAR | Status: AC
  Filled 2024-04-30: qty 2

## 2024-04-30 MED ORDER — PREDNISONE 10 MG (21) PO TBPK: ORAL_TABLET | ORAL | 0 refills | Status: DC

## 2024-04-30 MED ORDER — ALBUTEROL SULFATE HFA 108 (90 BASE) MCG/ACT IN AERS: 1.0000 | INHALATION_SPRAY | Freq: Four times a day (QID) | RESPIRATORY_TRACT | 0 refills | Status: DC | PRN

## 2024-04-30 MED ORDER — METHYLPREDNISOLONE SODIUM SUCC 125 MG IJ SOLR
80.0000 mg | Freq: Once | INTRAMUSCULAR | Status: AC
  Administered 2024-04-30: 80 mg via INTRAMUSCULAR

## 2024-04-30 MED ORDER — IPRATROPIUM-ALBUTEROL 0.5-2.5 (3) MG/3ML IN SOLN
3.0000 mL | Freq: Once | RESPIRATORY_TRACT | Status: AC
  Administered 2024-04-30: 3 mL via RESPIRATORY_TRACT

## 2024-04-30 NOTE — ED Triage Notes (Signed)
 Pt reports had congestion and productive cough for almost 2 months. Went to [facility] ago and was prescribed Tessalon and cough syrup. Benadryl helped for two days.

## 2024-04-30 NOTE — Discharge Instructions (Addendum)
 We are treating you for an infection.  Start doxycycline 100 mg twice daily for 10 days.  Stay out of the sun while on this medication.  Use albuterol  every 4-6 hours as needed for shortness of breath and coughing fits as we discussed.  Start prednisone  tomorrow (05/01/2024).  Do not take NSAIDs with this medication including aspirin, ibuprofen /Advil , naproxen /Aleve .  I recommend over-the-counter medication including Mucinex, Flonase, Tylenol .  Make sure you rest and drink plenty of fluid.  If you are not feeling significantly better in a few days please return for reevaluation.  If anything worsens and you have high fever, worsening cough, shortness of breath, chest pain you need to go to the ER immediately.

## 2024-04-30 NOTE — ED Provider Notes (Signed)
 [facility]    CSN: [id number] Arrival date & time: 04/30/24  1037      History   Chief Complaint Chief Complaint  Patient presents with   Cough   Nasal Congestion    HPI Jaime Barton is a 43 y.o. male.   Patient presents today with a 6-week history of persistent cough with intermittent shortness of breath.  He was seen by different urgent care several weeks ago where he had a negative chest x-ray and tested negative for COVID and flu.  At that time he was given Promethazine DM and Tessalon but these have been ineffective in managing his symptoms.  He denies any known sick contacts.  He has been taking Benadryl over-the-counter without relief.  He has not taken additional over-the-counter medications.  Denies any recent antibiotics or steroids.  He denies any history of diabetes.  Denies any history of allergies, asthma, COPD, smoking.  He is having difficulty with daily activities including sleeping at night because of the severity of cough.    Past Medical History:  Diagnosis Date   Gout    Hypertension     Patient Active Problem List   Diagnosis Date Noted   Protrusion of lumbar intervertebral disc 10/25/2022   Acute right-sided low back pain with right-sided sciatica 03/29/2022    Past Surgical History:  Procedure Laterality Date   arm surgery  12/30/2010   left arm fracture repair   HERNIA REPAIR         Home Medications    Prior to Admission medications   Medication Sig Start Date End Date Taking? Authorizing Provider  albuterol  (VENTOLIN  HFA) 108 (90 Base) MCG/ACT inhaler Inhale 1-2 puffs into the lungs every 6 (six) hours as needed for wheezing or shortness of breath. 04/30/24  Yes Rusti Arizmendi K, PA-C  doxycycline (VIBRAMYCIN) 100 MG capsule Take 1 capsule (100 mg total) by mouth 2 (two) times daily. 04/30/24  Yes Rayhaan Huster K, PA-C  predniSONE  (STERAPRED UNI-PAK 21 TAB) 10 MG (21) TBPK tablet As directed 04/30/24  Yes Jayzon Taras K, PA-C   colchicine 0.6 MG tablet Take 0.6 mg by mouth 2 (two) times daily. prn 06/10/22   [provider]  lisinopril  (PRINIVIL ,ZESTRIL ) 10 MG tablet Take 1 tablet (10 mg total) by mouth daily. 02/09/19 06/12/20  Sandi Crosby, PA-C  lisinopril  (ZESTRIL ) 20 MG tablet Take 20 mg by mouth daily. 01/24/22   [provider]    Family History Family History  Problem Relation Age of Onset   Healthy Mother    Healthy Father    Healthy Sister    Healthy Brother    Healthy Sister     Social History Social History   Tobacco Use   Smoking status: Never   Smokeless tobacco: Never  Vaping Use   Vaping status: Never Used  Substance Use Topics   Alcohol use: Yes   Drug use: No     Allergies   Penicillins   Review of Systems Review of Systems  Constitutional:  Positive for activity change. Negative for appetite change, fatigue and fever.  HENT:  Positive for congestion. Negative for sinus pressure, sneezing and sore throat.   Respiratory:  Positive for cough, shortness of breath and wheezing. Negative for chest tightness.   Cardiovascular:  Negative for chest pain.  Gastrointestinal:  Negative for abdominal pain, diarrhea, nausea and vomiting.  Neurological:  Negative for dizziness, light-headedness and headaches.     Physical Exam Triage Vital Signs ED  Triage Vitals  Encounter Vitals Group     BP 04/30/24 1057 (!) 139/91     Systolic BP Percentile --      Diastolic BP Percentile --      Pulse Rate 04/30/24 1057 87     Resp 04/30/24 1057 15     Temp 04/30/24 1057 98.3 F (36.8 C)     Temp Source 04/30/24 1057 Oral     SpO2 04/30/24 1057 92 %     Weight --      Height --      Head Circumference --      Peak Flow --      Pain Score 04/30/24 1056 0     Pain Loc --      Pain Education --      Exclude from Growth Chart --    No data found.  Updated Vital Signs BP (!) 139/91 (BP Location: Right Arm)   Pulse 92   Temp 98.3 F (36.8 C) (Oral)   Resp 15    SpO2 95%   Visual Acuity Right Eye Distance:   Left Eye Distance:   Bilateral Distance:    Right Eye Near:   Left Eye Near:    Bilateral Near:     Physical Exam Vitals reviewed.  Constitutional:      General: He is awake.     Appearance: Normal appearance. He is well-developed. He is not ill-appearing.     Comments: Very pleasant male appears stated age in no acute distress sitting comfortably in exam room  HENT:     Head: Normocephalic and atraumatic.     Right Ear: Tympanic membrane, ear canal and external ear normal. Tympanic membrane is not erythematous or bulging.     Left Ear: Tympanic membrane, ear canal and external ear normal. Tympanic membrane is not erythematous or bulging.     Nose: Nose normal.     Mouth/Throat:     Pharynx: Uvula midline. Posterior oropharyngeal erythema present. No oropharyngeal exudate or uvula swelling.  Cardiovascular:     Rate and Rhythm: Normal rate and regular rhythm.     Heart sounds: Normal heart sounds, S1 normal and S2 normal. No murmur heard. Pulmonary:     Effort: Pulmonary effort is normal. No accessory muscle usage or respiratory distress.     Breath sounds: No stridor. Wheezing present. No rhonchi or rales.     Comments: Widespread wheezing Neurological:     Mental Status: He is alert.  Psychiatric:        Behavior: Behavior is cooperative.      UC Treatments / Results  Labs (all labs ordered are listed, but only abnormal results are displayed) Labs Reviewed - No data to display  EKG   Radiology No results found.  Procedures Procedures (including critical care time)  Medications Ordered in UC Medications  ipratropium-albuterol  (DUONEB) 0.5-2.5 (3) MG/3ML nebulizer solution 3 mL (3 mLs Nebulization Given 04/30/24 1117)  methylPREDNISolone  sodium succinate (SOLU-MEDROL ) 125 mg/2 mL injection 80 mg (80 mg Intramuscular Given 04/30/24 1115)    Initial Impression / Assessment and Plan / UC Course  I have reviewed the  triage vital signs and the nursing notes.  Pertinent labs & imaging results that were available during my care of the patient were reviewed by me and considered in my medical decision making (see chart for details).     Patient is well-appearing, afebrile, nontoxic, nontachycardic.  Chest x-ray was obtained which showed patchy perihilar infiltrates based on my  primary read.  Will treat for atypical pneumonia with doxycycline 100 mg twice daily for 10 days.  Discussed that he is to avoid prolonged sun exposure while on this medication due to associated photosensitivity.  At the time of discharge we were waiting for radiologist over read and we will contact him if this differs and changes our treatment plan.  He was given DuoNeb and Solu-Medrol  in clinic with significant improvement of wheezing symptoms and improvement of his oxygen saturation.  Will send him home with an albuterol  inhaler to be used every 4-6 hours as needed.  Will start prednisone  taper tomorrow (05/01/2024).  Discussed that he is not to take NSAIDs with this medication and risk of GI bleeding.  He is to rest and drink plenty of fluid.  Recommended over-the-counter medication including Mucinex, Flonase, Tylenol .  We discussed that if he is not feeling significantly better within a few days he should return for reevaluation.  If he has any worsening symptoms he needs to be seen immediately including high fever, worsening cough, shortness of breath, chest pain, nausea/vomiting interfering with oral intake.  Should return precautions given.  All questions were answered to patient satisfaction.  Final Clinical Impressions(s) / UC Diagnoses   Final diagnoses:  Subacute cough  Atypical pneumonia  Wheezing  Shortness of breath     Discharge Instructions      We are treating you for an infection.  Start doxycycline 100 mg twice daily for 10 days.  Stay out of the sun while on this medication.  Use albuterol  every 4-6 hours as needed for  shortness of breath and coughing fits as we discussed.  Start prednisone  tomorrow (05/01/2024).  Do not take NSAIDs with this medication including aspirin, ibuprofen /Advil , naproxen /Aleve .  I recommend over-the-counter medication including Mucinex, Flonase, Tylenol .  Make sure you rest and drink plenty of fluid.  If you are not feeling significantly better in a few days please return for reevaluation.  If anything worsens and you have high fever, worsening cough, shortness of breath, chest pain you need to go to the ER immediately.     ED Prescriptions     Medication Sig Dispense Auth. Provider   predniSONE  (STERAPRED UNI-PAK 21 TAB) 10 MG (21) TBPK tablet As directed 21 tablet Jaxyn Mestas K, PA-C   albuterol  (VENTOLIN  HFA) 108 (90 Base) MCG/ACT inhaler Inhale 1-2 puffs into the lungs every 6 (six) hours as needed for wheezing or shortness of breath. 18 g Derhonda Eastlick K, PA-C   doxycycline (VIBRAMYCIN) 100 MG capsule Take 1 capsule (100 mg total) by mouth 2 (two) times daily. 20 capsule Rosell Khouri K, PA-C      PDMP not reviewed this encounter.   Budd Cargo, PA-C 04/30/24 1141

## 2024-08-03 ENCOUNTER — Ambulatory Visit (HOSPITAL_COMMUNITY): Payer: Self-pay | Admitting: Emergency Medicine

## 2024-08-03 ENCOUNTER — Ambulatory Visit (INDEPENDENT_AMBULATORY_CARE_PROVIDER_SITE_OTHER)

## 2024-08-03 ENCOUNTER — Ambulatory Visit (HOSPITAL_COMMUNITY)
Admission: EM | Admit: 2024-08-03 | Discharge: 2024-08-03 | Disposition: A | Discharge status: Home / Self Care | Attending: Emergency Medicine | Admitting: Emergency Medicine

## 2024-08-03 ENCOUNTER — Encounter (HOSPITAL_COMMUNITY): Payer: Self-pay

## 2024-08-03 DIAGNOSIS — I10 Essential (primary) hypertension: Secondary | ICD-10-CM | POA: Diagnosis present

## 2024-08-03 DIAGNOSIS — R053 Chronic cough: Secondary | ICD-10-CM | POA: Diagnosis present

## 2024-08-03 DIAGNOSIS — Z113 Encounter for screening for infections with a predominantly sexual mode of transmission: Secondary | ICD-10-CM | POA: Diagnosis not present

## 2024-08-03 LAB — CBC WITH DIFFERENTIAL/PLATELET
Abs Immature Granulocytes: 0 K/uL (ref 0.00–0.07)
Basophils Absolute: 0.1 K/uL (ref 0.0–0.1)
Basophils Relative: 2 %
Eosinophils Absolute: 0.4 K/uL (ref 0.0–0.5)
Eosinophils Relative: 10 %
HCT: 47 % (ref 39.0–52.0)
Hemoglobin: 16.2 g/dL (ref 13.0–17.0)
Immature Granulocytes: 0 %
Lymphocytes Relative: 21 %
Lymphs Abs: 0.9 K/uL (ref 0.7–4.0)
MCH: 31.4 pg (ref 26.0–34.0)
MCHC: 34.5 g/dL (ref 30.0–36.0)
MCV: 91.1 fL (ref 80.0–100.0)
Monocytes Absolute: 0.7 K/uL (ref 0.1–1.0)
Monocytes Relative: 17 %
Neutro Abs: 2.1 K/uL (ref 1.7–7.7)
Neutrophils Relative %: 50 %
Platelets: 195 K/uL (ref 150–400)
RBC: 5.16 MIL/uL (ref 4.22–5.81)
RDW: 13.2 % (ref 11.5–15.5)
WBC: 4.2 K/uL (ref 4.0–10.5)
nRBC: 0 % (ref 0.0–0.2)

## 2024-08-03 LAB — COMPREHENSIVE METABOLIC PANEL WITH GFR
ALT: 100 U/L — ABNORMAL HIGH (ref 0–44)
AST: 265 U/L — ABNORMAL HIGH (ref 15–41)
Albumin: 3.8 g/dL (ref 3.5–5.0)
Alkaline Phosphatase: 57 U/L (ref 38–126)
Anion gap: 15 (ref 5–15)
BUN: 11 mg/dL (ref 6–20)
CO2: 21 mmol/L — ABNORMAL LOW (ref 22–32)
Calcium: 8.9 mg/dL (ref 8.9–10.3)
Chloride: 102 mmol/L (ref 98–111)
Creatinine, Ser: 0.81 mg/dL (ref 0.61–1.24)
GFR, Estimated: >60 mL/min (ref >60)
Glucose, Bld: 88 mg/dL (ref 70–99)
Potassium: 4.2 mmol/L (ref 3.5–5.1)
Sodium: 138 mmol/L (ref 135–145)
Total Bilirubin: 1.3 mg/dL — ABNORMAL HIGH (ref 0.0–1.2)
Total Protein: 7.5 g/dL (ref 6.5–8.1)

## 2024-08-03 LAB — BRAIN NATRIURETIC PEPTIDE: B Natriuretic Peptide: 10.7 pg/mL (ref 0.0–100.0)

## 2024-08-03 LAB — HIV ANTIBODY (ROUTINE TESTING W REFLEX): HIV Screen 4th Generation wRfx: NONREACTIVE

## 2024-08-03 MED ORDER — HYDROCHLOROTHIAZIDE 12.5 MG PO TABS: 12.5000 mg | ORAL_TABLET | Freq: Every day | ORAL | 0 refills | Status: DC

## 2024-08-03 MED ORDER — AMLODIPINE BESYLATE 5 MG PO TABS: 5.0000 mg | ORAL_TABLET | Freq: Every day | ORAL | 0 refills | Status: DC

## 2024-08-03 NOTE — ED Provider Notes (Signed)
 [facility]    CSN: [id number] Arrival date & time: 08/03/24  1225      History   Chief Complaint Chief Complaint  Patient presents with   Cough   Medication Refill    HPI Jaime Barton is a 43 y.o. male.  Here with 2-3 month history of cough Cough occurs when lying flat at nighttime. Usually dry.  He has to elevate himself in order to sleep Denies shortness of breath, chest pain or pressure, wheezing, swelling of extremities.  Denies history of heart disease  Seen at onset, his [facility] x-ray on 4/23 was negative.  He then came to this urgent care on 5/2, there were patchy infiltrates seen on chest x-ray.  He was treated for atypical pneumonia with doxycycline  for 10 days. Cough did not improve.   Denies history of heart failure He does have hypertension.  Has ran out of his medication, lisinopril  20 mg daily. However he does not take this consistently, if at all.  BP today 167/110 Denies headache, dizziness, vision changes, chest pain or pressure, shortness of breath, abdominal pain, weakness   Also would like STD testing. Unprotected intercourse recently. Not having symptoms at this time. Would like blood work.   Past Medical History:  Diagnosis Date   Gout    Hypertension     Patient Active Problem List   Diagnosis Date Noted   Protrusion of lumbar intervertebral disc 10/25/2022   Acute right-sided low back pain with right-sided sciatica 03/29/2022    Past Surgical History:  Procedure Laterality Date   arm surgery  12/30/2010   left arm fracture repair   HERNIA REPAIR       Home Medications    Prior to Admission medications   Medication Sig Start Date End Date Taking? Authorizing Provider  hydrochlorothiazide  (HYDRODIURIL ) 12.5 MG tablet Take 1 tablet (12.5 mg total) by mouth daily. 08/03/24  Yes Railee Bonillas, [place], PA-C  albuterol  (VENTOLIN  HFA) 108 (90 Base) MCG/ACT inhaler Inhale 1-2 puffs into the lungs every 6 (six) hours as needed for  wheezing or shortness of breath. 04/30/24   Raspet, Erin K, PA-C  colchicine 0.6 MG tablet Take 0.6 mg by mouth 2 (two) times daily. prn 06/10/22   [provider]    Family History Family History  Problem Relation Age of Onset   Healthy Mother    Healthy Father    Healthy Sister    Healthy Brother    Healthy Sister     Social History Social History   Tobacco Use   Smoking status: Never   Smokeless tobacco: Never  Vaping Use   Vaping status: Never Used  Substance Use Topics   Alcohol use: Yes   Drug use: No     Allergies   Penicillins and Amlodipine    Review of Systems Review of Systems As per HPI  Physical Exam Triage Vital Signs ED Triage Vitals  Encounter Vitals Group     BP 08/03/24 1257 (!) 167/110     Girls Systolic BP Percentile --      Girls Diastolic BP Percentile --      Boys Systolic BP Percentile --      Boys Diastolic BP Percentile --      Pulse Rate 08/03/24 1257 81     Resp 08/03/24 1257 18     Temp 08/03/24 1257 98.2 F (36.8 C)     Temp Source 08/03/24 1257 Oral     SpO2 08/03/24 1257 96 %  Weight --      Height --      Head Circumference --      Peak Flow --      Pain Score 08/03/24 1258 0     Pain Loc --      Pain Education --      Exclude from Growth Chart --    No data found.  Updated Vital Signs BP (!) 165/106 (BP Location: Left Arm)   Pulse 81   Temp 98.2 F (36.8 C) (Oral)   Resp 18   SpO2 96%   Physical Exam Vitals and nursing note reviewed.  Constitutional:      General: He is not in acute distress.    Appearance: Normal appearance.  HENT:     Mouth/Throat:     Pharynx: Oropharynx is clear.  Eyes:     Conjunctiva/sclera: Conjunctivae normal.     Pupils: Pupils are equal, round, and reactive to light.  Neck:     Vascular: No hepatojugular reflux or JVD.  Cardiovascular:     Rate and Rhythm: Normal rate and regular rhythm. No extrasystoles are present.    Pulses: Normal pulses.          Radial  pulses are 2+ on the right side and 2+ on the left side.     Heart sounds: Normal heart sounds, S1 normal and S2 normal.     No S3 sounds.  Pulmonary:     Effort: Pulmonary effort is normal.     Breath sounds: Normal breath sounds.  Abdominal:     Palpations: Abdomen is soft.  Musculoskeletal:     Cervical back: Full passive range of motion without pain.     Right lower leg: No edema.     Left lower leg: No edema.  Skin:    General: Skin is warm and dry.  Neurological:     Mental Status: He is alert and oriented to person, place, and time.     Cranial Nerves: No cranial nerve deficit.     Sensory: No sensory deficit.     Motor: No weakness.     Coordination: Coordination normal.     Gait: Gait normal.     UC Treatments / Results  Labs (all labs ordered are listed, but only abnormal results are displayed) Labs Reviewed  COMPREHENSIVE METABOLIC PANEL WITH GFR - Abnormal; Notable for the following components:      Result Value   CO2 21 (*)    AST 265 (*)    ALT 100 (*)    Total Bilirubin 1.3 (*)    All other components within normal limits  HIV ANTIBODY (ROUTINE TESTING W REFLEX)  CBC WITH DIFFERENTIAL/PLATELET  BRAIN NATRIURETIC PEPTIDE  RPR  CYTOLOGY, (ORAL, ANAL, URETHRAL) ANCILLARY ONLY    EKG  Radiology DG Chest 2 View Result Date: 08/03/2024 CLINICAL DATA:  Cough EXAM: CHEST - 2 VIEW COMPARISON:  None Available. FINDINGS: The heart size and mediastinal contours are within normal limits. Both lungs are clear. The visualized skeletal structures are unremarkable. IMPRESSION: No active cardiopulmonary disease. Electronically Signed   By: Dorethia Molt M.D.   On: 08/03/2024 14:46    Procedures Procedures (including critical care time)  Medications Ordered in UC Medications - No data to display  Initial Impression / Assessment and Plan / UC Course  I have reviewed the triage vital signs and the nursing notes.  Pertinent labs & imaging results that were available  during my care of the patient were  reviewed by me and considered in my medical decision making (see chart for details).  1) Persistent cough At night when lying flat for several months. Good physical exam.  Lungs are clear, heart regular rate and rhythm, no peripheral edema, no JVD. Chest x-ray negative. Normal heart size. Images independently reviewed by me, agree with radiology interpretation. CBC unremarkable. CMP with elevated AST and ALT. He does report frequent alcohol use. BNP 10.7  2) Hypertension Has been prescribed lisinopril  in the past.  Does not regularly take this.  Since he is having cough I would not recommend restarting the lisinopril .  He reports amlodipine  gives him headaches. With consent I have added this to his allergy list as an intolerance. Instead we will start on HCTZ  12.5 mg daily.  He understands the importance of taking this medication every single day as directed.  I have also set him up with a primary care visit for next week.  He will follow-up with them regarding further management.  3) STD testing Cytology swab is pending along with HIV and RPR.  Contact if any treatment needed  Final Clinical Impressions(s) / UC Diagnoses   Final diagnoses:  Persistent cough  Primary hypertension  Screen for STD (sexually transmitted disease)     Discharge Instructions      For your blood pressure, start taking the hydrodiuril , one tablet daily. Take every single day! Follow up with your primary care provider at your visit next week.  I will call you later today if there is anything abnormal on your blood work or chest x-ray.  We will call you in 2-3 days if anything on your swab returns positive. You can also see these results on MyChart. Please abstain from sexual intercourse until your results return. We do not call with negative results.   Please go to the emergency department if you develop any new symptoms like headache, dizziness, chest pain, shortness of  breath, or extremity swelling.     ED Prescriptions     Medication Sig Dispense Auth. Provider   amLODipine  (NORVASC ) 5 MG tablet  (Status: Discontinued) Take 1 tablet (5 mg total) by mouth daily. 30 tablet Estela Vinal, PA-C   hydrochlorothiazide  (HYDRODIURIL ) 12.5 MG tablet Take 1 tablet (12.5 mg total) by mouth daily. 30 tablet Toriana Sponsel, [place], PA-C      PDMP not reviewed this encounter.   Jeryl [place], [place] 08/03/24 [id number]

## 2024-08-03 NOTE — ED Triage Notes (Signed)
 Pt on phone call, will triage after his call.

## 2024-08-03 NOTE — ED Triage Notes (Signed)
 Pt c/o productive cough when laying flat x2 months. States has to elevate to sleep.  Pt requesting b/p med refill, been out for a week.  Pt requesting STD testing. Denies sx's. States had unprotected intercourse last Thursday.

## 2024-08-03 NOTE — Discharge Instructions (Addendum)
 For your blood pressure, start taking the hydrodiuril , one tablet daily. Take every single day! Follow up with your primary care provider at your visit next week.  I will call you later today if there is anything abnormal on your blood work or chest x-ray.  We will call you in 2-3 days if anything on your swab returns positive. You can also see these results on MyChart. Please abstain from sexual intercourse until your results return. We do not call with negative results.   Please go to the emergency department if you develop any new symptoms like headache, dizziness, chest pain, shortness of breath, or extremity swelling.

## 2024-08-04 LAB — CYTOLOGY, (ORAL, ANAL, URETHRAL) ANCILLARY ONLY
Chlamydia: NEGATIVE
Comment: NEGATIVE
Comment: NEGATIVE
Comment: NORMAL
Neisseria Gonorrhea: NEGATIVE
Trichomonas: POSITIVE — AB

## 2024-08-04 LAB — RPR: RPR Ser Ql: NONREACTIVE

## 2024-08-04 MED ORDER — METRONIDAZOLE 500 MG PO TABS: 2000.0000 mg | ORAL_TABLET | Freq: Once | ORAL | 0 refills | Status: AC

## 2024-08-10 ENCOUNTER — Other Ambulatory Visit: Payer: Self-pay

## 2024-08-10 ENCOUNTER — Emergency Department (HOSPITAL_BASED_OUTPATIENT_CLINIC_OR_DEPARTMENT_OTHER): Admission: EM | Admit: 2024-08-10 | Discharge: 2024-08-10 | Disposition: A | Discharge status: Home / Self Care

## 2024-08-10 DIAGNOSIS — R350 Frequency of micturition: Secondary | ICD-10-CM | POA: Diagnosis not present

## 2024-08-10 DIAGNOSIS — Z79899 Other long term (current) drug therapy: Secondary | ICD-10-CM | POA: Insufficient documentation

## 2024-08-10 DIAGNOSIS — R300 Dysuria: Secondary | ICD-10-CM | POA: Diagnosis present

## 2024-08-10 DIAGNOSIS — I10 Essential (primary) hypertension: Secondary | ICD-10-CM | POA: Diagnosis not present

## 2024-08-10 LAB — URINALYSIS, ROUTINE W REFLEX MICROSCOPIC
Bacteria, UA: NONE SEEN
Bilirubin Urine: NEGATIVE
Glucose, UA: NEGATIVE mg/dL
Hgb urine dipstick: NEGATIVE
Ketones, ur: >80 mg/dL — AB
Leukocytes,Ua: NEGATIVE
Nitrite: NEGATIVE
Protein, ur: 30 mg/dL — AB
Specific Gravity, Urine: 1.032 — ABNORMAL HIGH (ref 1.005–1.030)
pH: 5.5 (ref 5.0–8.0)

## 2024-08-10 LAB — WET PREP, GENITAL
Clue Cells Wet Prep HPF POC: NONE SEEN
Sperm: NONE SEEN
Trich, Wet Prep: NONE SEEN
WBC, Wet Prep HPF POC: <10 (ref <10)
Yeast Wet Prep HPF POC: NONE SEEN

## 2024-08-10 LAB — RAPID HIV SCREEN (HIV 1/2 AB+AG)
HIV 1/2 Antibodies: NONREACTIVE
HIV-1 P24 Antigen - HIV24: NONREACTIVE

## 2024-08-10 NOTE — ED Triage Notes (Signed)
 Patient reports increased urination. He also notes some irritation. He was put on antibiotics for an STI. He reports he took all the antibiotics but still has symptoms.

## 2024-08-10 NOTE — ED Provider Notes (Signed)
 [place] EMERGENCY DEPARTMENT AT [facility] Provider Note   CSN: [id number] Arrival date & time: 08/10/24  1744     Patient presents with: Urinary Frequency   Jaime Barton is a 43 y.o. male with PMHx gout and HTN who presents to ED concerned for very mild dysuria and urinary frequency x1 week. Patient went to UC last week for STD testing after unprotected sex and was diagnosed with trichomonas.  Patient symptoms started after providing samples for STD testing.  Patient was asymptomatic prior to STD testing.  Patient thinks that these symptoms are possible from he irritating his urethra during the STD swab. Patient has not had unprotexted sex since the STD testing. Patient denies penile discharge or hematuria. Denies pain in penis or testicles. Patient was placed on 2g Metronidazole  single dose and tolerated it well.    Urinary Frequency       Prior to Admission medications   Medication Sig Start Date End Date Taking? Authorizing Provider  albuterol  (VENTOLIN  HFA) 108 (90 Base) MCG/ACT inhaler Inhale 1-2 puffs into the lungs every 6 (six) hours as needed for wheezing or shortness of breath. 04/30/24   Raspet, Erin K, PA-C  colchicine 0.6 MG tablet Take 0.6 mg by mouth 2 (two) times daily. prn 06/10/22   [provider]  hydrochlorothiazide  (HYDRODIURIL ) 12.5 MG tablet Take 1 tablet (12.5 mg total) by mouth daily. 08/03/24   Rising, [place], PA-C    Allergies: Penicillins and Amlodipine     Review of Systems  Genitourinary:  Positive for frequency.    Updated Vital Signs BP (!) 167/115   Pulse 90   Temp 98.5 F (36.9 C) (Oral)   Resp 14   SpO2 98%   Physical Exam Vitals and nursing note reviewed.  Constitutional:      General: He is not in acute distress.    Appearance: He is not ill-appearing or toxic-appearing.  HENT:     Head: Normocephalic and atraumatic.  Eyes:     General: No scleral icterus.       Right eye: No discharge.        Left eye: No  discharge.     Conjunctiva/sclera: Conjunctivae normal.  Cardiovascular:     Rate and Rhythm: Normal rate.  Pulmonary:     Effort: Pulmonary effort is normal.  Abdominal:     General: Abdomen is flat.  Genitourinary:    Comments: Chaperone RN Ore: Penis without lesions, discharge, or swelling/erythema. Testicles unremarkable. Skin:    General: Skin is warm and dry.  Neurological:     General: No focal deficit present.     Mental Status: He is alert. Mental status is at baseline.  Psychiatric:        Mood and Affect: Mood normal.        Behavior: Behavior normal.     (all labs ordered are listed, but only abnormal results are displayed) Labs Reviewed  URINALYSIS, ROUTINE W REFLEX MICROSCOPIC - Abnormal; Notable for the following components:      Result Value   Specific Gravity, Urine 1.032 (*)    Ketones, ur >80 (*)    Protein, ur 30 (*)    All other components within normal limits  WET PREP, GENITAL  RPR  RAPID HIV SCREEN (HIV 1/2 AB+AG)  GC/CHLAMYDIA PROBE AMP ([place]) NOT AT [facility]    EKG: None  Radiology: No results found.   Procedures   Medications Ordered in the ED - No data to display  Medical Decision Making Amount and/or Complexity of Data Reviewed Labs: ordered.   This patient presents to the ED for concern of dysuria, this involves an extensive number of treatment options, and is a complaint that carries with it a high risk of complications and morbidity.  The differential diagnosis includes UTI, STD, pyelonephritis, sepsis   Co morbidities that complicate the patient evaluation  Gout, hypertension   Additional history obtained:  Additional history obtained from 8/5 ED note: patient tested positive for trich and placed on flagyl .    Problem List / ED Course / Critical interventions / Medication management  Patient presents to ED concern for mild dysuria and urinary frequency after submitting swabs for  STD testing last week. Patient thinks that he irritated his penis with the STD swab last week. Patient denies any other symptoms for STDs.  Patient denies any pain in the penis, testicles, or abdomen.  Physical exam reassuring.  Patient afebrile with stable vitals. I Ordered, and personally interpreted labs.  UA not concerning for infection.  Rest of STD panel is send out.   Patient agrees to follow up with STD results with the department of public health.  I have reviewed the patients home medicines and have made adjustments as needed The patient has been appropriately medically screened and/or stabilized in the ED. I have low suspicion for any other emergent medical condition which would require further screening, evaluation or treatment in the ED or require inpatient management. At time of discharge the patient is hemodynamically stable and in no acute distress. I have discussed work-up results and diagnosis with patient and answered all questions. Patient is agreeable with discharge plan. We discussed strict return precautions for returning to the emergency department and they verbalized understanding.     Social Determinants of Health:  none      Final diagnoses:  Dysuria    ED Discharge Orders     None          Hoy Nidia FALCON, [place] 08/10/24 1953    Simon Lavonia SAILOR, MD 08/10/24 [id number]

## 2024-08-10 NOTE — Discharge Instructions (Signed)
 As discussed, your labs are send-outs and will result in the next 1 to 3 days.  You will need to follow-up with the Department of Public Health for these results.  You may also follow-up with your primary care provider.  Seek emergency care if experiencing any new or worsening symptoms.

## 2024-08-10 NOTE — ED Notes (Signed)
 Discharge instructions reviewed.   Opportunity for questions and concerns provided.   Alert, oriented and ambulatory.   Displays no signs of distress.   Encouraged to check mychart, and to follow up with [facility]. Declined discharge vital signs.

## 2024-08-11 LAB — GC/CHLAMYDIA PROBE AMP (~~LOC~~) NOT AT ARMC
Chlamydia: NEGATIVE
Comment: NEGATIVE
Comment: NORMAL
Neisseria Gonorrhea: NEGATIVE

## 2024-08-11 LAB — RPR: RPR Ser Ql: NONREACTIVE

## 2024-08-12 ENCOUNTER — Ambulatory Visit

## 2024-10-04 ENCOUNTER — Ambulatory Visit

## 2024-11-19 ENCOUNTER — Ambulatory Visit (HOSPITAL_COMMUNITY)

## 2025-01-07 ENCOUNTER — Encounter (HOSPITAL_COMMUNITY): Payer: Self-pay | Admitting: Emergency Medicine

## 2025-01-07 ENCOUNTER — Ambulatory Visit (HOSPITAL_COMMUNITY): Admission: EM | Admit: 2025-01-07 | Discharge: 2025-01-07 | Disposition: A | Discharge status: Home / Self Care

## 2025-01-07 DIAGNOSIS — R053 Chronic cough: Secondary | ICD-10-CM

## 2025-01-07 DIAGNOSIS — R062 Wheezing: Secondary | ICD-10-CM | POA: Diagnosis not present

## 2025-01-07 MED ORDER — ALBUTEROL SULFATE HFA 108 (90 BASE) MCG/ACT IN AERS: 1.0000 | INHALATION_SPRAY | RESPIRATORY_TRACT | 0 refills | Status: AC | PRN

## 2025-01-07 MED ORDER — ALBUTEROL SULFATE (2.5 MG/3ML) 0.083% IN NEBU
2.5000 mg | INHALATION_SOLUTION | Freq: Once | RESPIRATORY_TRACT | Status: AC
  Administered 2025-01-07: 2.5 mg via RESPIRATORY_TRACT

## 2025-01-07 MED ORDER — ALBUTEROL SULFATE (2.5 MG/3ML) 0.083% IN NEBU
INHALATION_SOLUTION | RESPIRATORY_TRACT | Status: AC
  Filled 2025-01-07: qty 3

## 2025-01-07 MED ORDER — FLUTICASONE PROPIONATE 50 MCG/ACT NA SUSP: 2.0000 | Freq: Every day | NASAL | 0 refills | Status: AC

## 2025-01-07 MED ORDER — SPACER/AERO-HOLDING CHAMBERS DEVI: 1.0000 | 0 refills | Status: AC | PRN

## 2025-01-07 NOTE — ED Provider Notes (Signed)
 " [facility]    CSN: [id number] Arrival date & time: 01/07/25  1118      History   Chief Complaint Chief Complaint  Patient presents with   Cough    HPI Jaime HADDIX is a 44 y.o. male.   Cough for 6+ months. Is not sick right now, coughs even when not sick. Worst at night when lying down. Denies post nasal drainage/clearing throat a lot. Denies nasal congestion. Denies heartburn/reflux. Cough is often productive of mucus. Denies hx of asthma/COPD, does not smoke. Thinks he is wheezing sometimes  Is not takig lisinopril  right now, only hydrochlorothiazide . Does not feel cough is worse on lisinopril    Has been seen for this before, last in 08/03/24, had CXR that was normal. Rx cough medicine at that time that did not help   Cough   Past Medical History:  Diagnosis Date   Gout    Hypertension     Patient Active Problem List   Diagnosis Date Noted   Protrusion of lumbar intervertebral disc 10/25/2022   Acute right-sided low back pain with right-sided sciatica 03/29/2022    Past Surgical History:  Procedure Laterality Date   arm surgery  12/30/2010   left arm fracture repair   HERNIA REPAIR         Home Medications    Prior to Admission medications  Medication Sig Start Date End Date Taking? Authorizing Provider  albuterol  (VENTOLIN  HFA) 108 (90 Base) MCG/ACT inhaler Inhale 1-2 puffs into the lungs every 4 (four) hours as needed for wheezing or shortness of breath. 01/07/25  Yes Richad Jon HERO, NP  fluticasone  (FLONASE ) 50 MCG/ACT nasal spray Place 2 sprays into both nostrils daily. 01/07/25  Yes Richad Jon HERO, NP  lisinopril  (ZESTRIL ) 20 MG tablet Take 20 mg by mouth daily. 10/04/24  Yes [provider]  Spacer/Aero-Holding Chambers DEVI 1 each by Does not apply route as needed. 01/07/25  Yes Richad Jon HERO, NP  colchicine 0.6 MG tablet Take 0.6 mg by mouth 2 (two) times daily. prn 06/10/22   [provider]  hydrochlorothiazide   (HYDRODIURIL ) 12.5 MG tablet Take 1 tablet (12.5 mg total) by mouth daily. 08/03/24   Rising, [place] RIGGERS    Family History Family History  Problem Relation Age of Onset   Healthy Mother    Healthy Father    Healthy Sister    Healthy Brother    Healthy Sister     Social History Social History[1]   Allergies   Morphine, Penicillins, and Amlodipine    Review of Systems Review of Systems  Respiratory:  Positive for cough.      Physical Exam Triage Vital Signs ED Triage Vitals  Encounter Vitals Group     BP 01/07/25 1146 (!) 132/90     Girls Systolic BP Percentile --      Girls Diastolic BP Percentile --      Boys Systolic BP Percentile --      Boys Diastolic BP Percentile --      Pulse Rate 01/07/25 1146 100     Resp 01/07/25 1146 15     Temp 01/07/25 1146 98.3 F (36.8 C)     Temp Source 01/07/25 1146 Oral     SpO2 01/07/25 1146 94 %     Weight --      Height --      Head Circumference --      Peak Flow --      Pain Score 01/07/25 1145 0  Pain Loc --      Pain Education --      Exclude from Growth Chart --    No data found.  Updated Vital Signs BP (!) 132/90 (BP Location: Left Arm)   Pulse 100   Temp 98.3 F (36.8 C) (Oral)   Resp 15   SpO2 94%   Visual Acuity Right Eye Distance:   Left Eye Distance:   Bilateral Distance:    Right Eye Near:   Left Eye Near:    Bilateral Near:     Physical Exam Constitutional:      General: He is not in acute distress.    Appearance: Normal appearance. He is not ill-appearing.  HENT:     Mouth/Throat:     Mouth: Mucous membranes are moist.     Pharynx: Oropharynx is clear.  Pulmonary:     Effort: Pulmonary effort is normal.     Breath sounds: Wheezing present.     Comments: No coughing observed Neurological:     Mental Status: He is alert.      UC Treatments / Results  Labs (all labs ordered are listed, but only abnormal results are displayed) Labs Reviewed - No data to  display  EKG   Radiology No results found.  Procedures Procedures (including critical care time)  Medications Ordered in UC Medications  albuterol  (PROVENTIL ) (2.5 MG/3ML) 0.083% nebulizer solution 2.5 mg (2.5 mg Nebulization Given 01/07/25 1257)    Initial Impression / Assessment and Plan / UC Course  I have reviewed the triage vital signs and the nursing notes.  Pertinent labs & imaging results that were available during my care of the patient were reviewed by me and considered in my medical decision making (see chart for details).    Wheezing completely cleared after albuterol  and pt reports feeling better.   He did not cough once while in my presence during appt. However, he reports cough is mostly at night. Could be coughing due to asthma, allergies; reflux could also be cause of cough.   I want to start with treating possible upper airway cough syndrome and wheezing. He will try flonase , an oral antihistamine, and albuterol  prn. He will work to find pcp - discussed how to get one at [facility]. He may need preventive asthma med like inhaled corticosteroid going forward. He may also need a trial of something like famotidine if his cough does not improve with these treatments.   Final Clinical Impressions(s) / UC Diagnoses   Final diagnoses:  Chronic cough  Wheezing     Discharge Instructions      I also recommend taking an antihistamine daily. Zyrtec or Claritin (generic brands of either are fine) would work. These are not covered by your insurance so I can't prescribe but they are available over the counter.   Use the albuterol  with a spacer every 4-6 hours if needed for wheezing, coughing, or shortness of breath. If you find you need to use it more than twice a week, you may need medicine to prevent wheezing/coughing symptoms in addition to the albuterol  rescue medicine  Please get set up with a primary care provider.   If you do not have a primary care provider,  and you want to find one at [facility], go to [email or web address], click on primary care, click on new patients: schedule online, choose internal medicine or family medicine, answer a series of questions, and choose an appointment location and time that fits your schedule.  ED Prescriptions     Medication Sig Dispense Auth. Provider   albuterol  (VENTOLIN  HFA) 108 (90 Base) MCG/ACT inhaler Inhale 1-2 puffs into the lungs every 4 (four) hours as needed for wheezing or shortness of breath. 1 each Richad Jon HERO, NP   Spacer/Aero-Holding Raguel FRENCH 1 each by Does not apply route as needed. 1 each Marvene Strohm M, NP   fluticasone  (FLONASE ) 50 MCG/ACT nasal spray Place 2 sprays into both nostrils daily. 16 g Richad Jon HERO, NP      PDMP not reviewed this encounter.    [1]  Social History Tobacco Use   Smoking status: Never   Smokeless tobacco: Never  Vaping Use   Vaping status: Never Used  Substance Use Topics   Alcohol use: Yes   Drug use: No     Richad Jon HERO, NP 01/07/25 1335  "

## 2025-01-07 NOTE — ED Triage Notes (Signed)
 Pt reports had cough for 6 months. Reports was seen for it before and get syrup and something else that didn't help.

## 2025-01-07 NOTE — Discharge Instructions (Signed)
 I also recommend taking an antihistamine daily. Zyrtec or Claritin (generic brands of either are fine) would work. These are not covered by your insurance so I can't prescribe but they are available over the counter.   Use the albuterol  with a spacer every 4-6 hours if needed for wheezing, coughing, or shortness of breath. If you find you need to use it more than twice a week, you may need medicine to prevent wheezing/coughing symptoms in addition to the albuterol  rescue medicine  Please get set up with a primary care provider.   If you do not have a primary care provider, and you want to find one at [facility], go to [email or web address], click on primary care, click on new patients: schedule online, choose internal medicine or family medicine, answer a series of questions, and choose an appointment location and time that fits your schedule.

## 2025-01-17 ENCOUNTER — Ambulatory Visit: Admitting: Family Medicine

## 2025-01-17 ENCOUNTER — Encounter: Payer: Self-pay | Admitting: Family Medicine

## 2025-01-17 VITALS — BP 159/110 | HR 88 | Temp 98.3°F | Ht 70.0 in | Wt 165.8 lb

## 2025-01-17 DIAGNOSIS — I10 Essential (primary) hypertension: Secondary | ICD-10-CM

## 2025-01-17 DIAGNOSIS — J440 Chronic obstructive pulmonary disease with acute lower respiratory infection: Secondary | ICD-10-CM

## 2025-01-17 DIAGNOSIS — Z7689 Persons encountering health services in other specified circumstances: Secondary | ICD-10-CM

## 2025-01-17 MED ORDER — LEVOFLOXACIN 750 MG PO TABS: 750.0000 mg | ORAL_TABLET | Freq: Every day | ORAL | 0 refills | Status: AC

## 2025-01-17 MED ORDER — IPRATROPIUM-ALBUTEROL 0.5-2.5 (3) MG/3ML IN SOLN
3.0000 mL | Freq: Once | RESPIRATORY_TRACT | Status: AC
  Administered 2025-01-17: 3 mL via RESPIRATORY_TRACT

## 2025-01-17 MED ORDER — PREDNISONE 50 MG PO TABS: ORAL_TABLET | ORAL | 0 refills | Status: AC

## 2025-01-17 MED ORDER — METHYLPREDNISOLONE SODIUM SUCC 125 MG IJ SOLR
125.0000 mg | Freq: Once | INTRAMUSCULAR | Status: AC
  Administered 2025-01-17: 125 mg via INTRAMUSCULAR

## 2025-01-17 MED ORDER — LISINOPRIL-HYDROCHLOROTHIAZIDE 20-12.5 MG PO TABS: 1.0000 | ORAL_TABLET | Freq: Every day | ORAL | 3 refills | Status: AC

## 2025-01-17 NOTE — Progress Notes (Signed)
 " Assessment & Plan   Assessment/Plan:    Assessment and Plan Assessment & Plan Chronic obstructive pulmonary disease with acute lower respiratory infection Chronic cough persisting for eight months, worsening over the last two weeks, with nocturnal symptoms and mucus production. Previous chest x-ray and BNP were negative. No smoking history. Albuterol  inhaler ineffective. Wheezing present on examination. Possible underlying asthma or incomplete treatment of previous pneumonia. Differential includes chronic bronchitis with acute exacerbation. - Administered duoneb in office - Methylprednisolone  125 mg IM - Prescribed levofloxacin  750 mg once daily for 14 days. - Prescribed prednisone  50 mg daily for 5 days starting tomorrow. - Ordered chest CT scan stat - Referred to pulmonology. - Advised follow-up in one week to reassess blood pressure and lung sounds. - Instructed to go to ED or urgent care if symptoms worsen, especially chest pain, shortness of breath, uncontrolled wheezing, fevers, or chills.  Hypertension Blood pressure elevated at 158/88 mmHg. Currently managed with lisinopril  20 mg and hydrochlorothiazide  12.5 mg. - Transitioned to combination pill of lisinopril  and hydrochlorothiazide  for daily use.        Medications Discontinued During This Encounter  Medication Reason   hydrochlorothiazide  (HYDRODIURIL ) 12.5 MG tablet    lisinopril  (ZESTRIL ) 20 MG tablet Reorder    Return in about 1 week (around 01/24/2025) for bronchitis.        Subjective:   Encounter date: 01/17/2025  Jaime Barton is a 44 y.o. male who has Acute right-sided low back pain with right-sided sciatica; Protrusion of lumbar intervertebral disc; Chronic obstructive pulmonary disease with acute lower respiratory infection (HCC); and Primary hypertension on their problem list..   He  has a past medical history of Gout and Hypertension.SABRA   He presents with chief complaint of Cough (Started 8  months ago worse last week wheezing recent visit urgent /Skin irritation on left arm ) .   Discussed the use of AI scribe software for clinical note transcription with the patient, who gave verbal consent to proceed.  History of Present Illness Jaime Barton is a 44 year old male with hypertension who presents with a chronic cough.  Chronic cough - Cough present for approximately eight months - Initially sought care at urgent care facilities multiple times - Prescribed various medications including Butisol and Coumadin without symptom relief - Cough has worsened over the last two weeks - Cough disrupts sleep, occurring throughout the night and producing mucus - Cough subsides in the morning but can recur during the day, lasting about three minutes at a time - No history of smoking - No known history of asthma  Prior diagnostic evaluation and treatment for cough - Chest x-ray in August 2025 was normal - BNP test was negative - Treated with doxycycline  and prednisone  in May 2025; does not recall a diagnosis of pneumonia - Recently prescribed albuterol  inhaler; initially used three puffs instead of recommended one puff every four hours - Albuterol  inhaler has not been effective  Hypertension management - Hypertension managed with lisinopril  20 mg and hydrochlorothiazide  12.5 mg - Due for medication refill     ROS  Past Surgical History:  Procedure Laterality Date   arm surgery  12/30/2010   left arm fracture repair   HERNIA REPAIR      Outpatient Medications Prior to Visit  Medication Sig Dispense Refill   hydrochlorothiazide  (HYDRODIURIL ) 12.5 MG tablet Take 1 tablet (12.5 mg total) by mouth daily. 30 tablet 0   lisinopril  (ZESTRIL ) 20 MG tablet Take 20 mg by  mouth daily.     albuterol  (VENTOLIN  HFA) 108 (90 Base) MCG/ACT inhaler Inhale 1-2 puffs into the lungs every 4 (four) hours as needed for wheezing or shortness of breath. 1 each 0   colchicine 0.6 MG tablet Take 0.6  mg by mouth 2 (two) times daily. prn     fluticasone  (FLONASE ) 50 MCG/ACT nasal spray Place 2 sprays into both nostrils daily. 16 g 0   Spacer/Aero-Holding Chambers DEVI 1 each by Does not apply route as needed. 1 each 0   No facility-administered medications prior to visit.    Family History  Problem Relation Age of Onset   Healthy Mother    Healthy Father    Healthy Sister    Healthy Brother    Healthy Sister     Social History   Socioeconomic History   Marital status: Single    Spouse name: Not on file   Number of children: Not on file   Years of education: Not on file   Highest education level: Not on file  Occupational History   Not on file  Tobacco Use   Smoking status: Never   Smokeless tobacco: Never  Vaping Use   Vaping status: Never Used  Substance and Sexual Activity   Alcohol use: Yes   Drug use: No   Sexual activity: Yes    Birth control/protection: None  Other Topics Concern   Not on file  Social History Narrative   Not on file   Social Drivers of Health   Tobacco Use: Low Risk (01/17/2025)   Patient History    Smoking Tobacco Use: Never    Smokeless Tobacco Use: Never    Passive Exposure: Not on file  Financial Resource Strain: Not on file  Food Insecurity: Not on file  Transportation Needs: Not on file  Physical Activity: Not on file  Stress: Not on file  Social Connections: Not on file  Intimate Partner Violence: Not on file  Depression (PHQ2-9): Low Risk (01/17/2025)   Depression (PHQ2-9)    PHQ-2 Score: 1  Alcohol Screen: Not on file  Housing: Not on file  Utilities: Not on file  Health Literacy: Not on file                                                                                                  Objective:  Physical Exam: BP (!) 159/110   Pulse 88   Temp 98.3 F (36.8 C) (Oral)   Ht 5' 10 (1.778 m)   Wt 165 lb 12.8 oz (75.2 kg)   SpO2 98%   BMI 23.79 kg/m    Physical Exam V GENERAL: Alert, cooperative, well  developed, no acute distress HEENT: Normocephalic, normal oropharynx, moist mucous membranes CHEST: Wheezing present, throuthout no rhonchi or crackles CARDIOVASCULAR: Normal heart rate and rhythm, S1 and S2 normal without murmurs ABDOMEN: Soft, non-tender, non-distended, without organomegaly, normal bowel sounds EXTREMITIES: No cyanosis or edema NEUROLOGICAL: Cranial nerves grossly intact, moves all extremities without gross motor or sensory deficit   Physical Exam  No results found.  No results found for this or any  previous visit (from the past 2160 hours).      Beverley Adine Hummer, MD, MS "

## 2025-01-17 NOTE — Patient Instructions (Addendum)
 It was very nice to see you today!  VISIT SUMMARY: During your visit, we addressed your chronic cough and hypertension. We provided treatment for your cough and adjusted your hypertension medication.  YOUR PLAN: CHRONIC COUGH: You have had a chronic cough for eight months, which has worsened over the last two weeks. The cough disrupts your sleep and produces mucus. -Administered ipratropium inhaler in the office. -Prescribed levofloxacin  750 mg once daily for 14 days. -Prescribed prednisone  50 mg daily for 5 days starting tomorrow. -Ordered a chest CT scan. -Referred you to a pulmonologist. -Follow up in one week to reassess your blood pressure and lung sounds. -Go to the emergency department or urgent care if your symptoms worsen, especially if you experience chest pain, shortness of breath, uncontrolled wheezing, fevers, or chills.  HYPERTENSION: Your blood pressure was elevated at 158/88 mmHg. You are currently taking lisinopril  and hydrochlorothiazide . -Transitioned you to a combination pill of lisinopril  and hydrochlorothiazide  for daily use.  Return in about 1 week (around 01/24/2025) for bronchitis.   Take care, Arvella Hummer, MD, MS   PLEASE NOTE:  If you had any lab tests, please let us  know if you have not heard back within a few days. You may see your results on mychart before we have a chance to review them but we will give you a call once they are reviewed by us .   If we ordered any referrals today, please let us  know if you have not heard from their office within the next week.   If you had any urgent prescriptions sent in today, please check with the pharmacy within an hour of our visit to make sure the prescription was transmitted appropriately.   Please try these tips to maintain a healthy lifestyle:  Eat at least 3 REAL meals and 1-2 snacks per day.  Aim for no more than 5 hours between eating.  If you eat breakfast, please do so within one hour of getting up.    Each meal should contain half fruits/vegetables, one quarter protein, and one quarter carbs (no bigger than a computer mouse)  Cut down on sweet beverages. This includes juice, soda, and sweet tea.   Drink at least 1 glass of water  with each meal and aim for at least 8 glasses per day  Exercise at least 150 minutes every week.

## 2025-01-24 ENCOUNTER — Ambulatory Visit: Admitting: Family Medicine

## 2025-01-24 NOTE — Progress Notes (Incomplete)
 Virtual Visit via Video Note  I connected with Jaime Barton on 01/24/2025 at  1:40 PM EST by a video enabled telemedicine application and verified that I am speaking with the correct person using two identifiers.  Location: Patient: home Provider: office   I discussed the limitations of evaluation and management by telemedicine and the availability of in person appointments. The patient expressed understanding and agreed to proceed.    No chief complaint on file.   Discussed the use of AI scribe software for clinical note transcription with the patient, who gave verbal consent to proceed.  History of Present Illness          Chronic obstructive pulmonary disease with acute lower respiratory infection - Seen at our initial visit with a chronic cough persisting for eight months, that acutely worsened over the previous two weeks, with nocturnal symptoms and mucus production.  - Previous chest x-ray and BNP were negative. No smoking history. Albuterol  inhaler ineffective. Wheezing present on examination.  - Prescribed levofloxacin  750 mg once daily for 14 days and prednisone  50 mg daily for 5 days.   Hypertension - Currently managed with lisinopril  - hydrochlorothiazide   20 mg - 12.5 mg daily. - Last blood pressure at 158/88 mmHg.    Observations/Objective: There were no vitals filed for this visit.  Gen: NAD, resting comfortably HEENT: EOMI Pulm: NWOB Skin: no rash on face Neuro: no facial asymmetry or dysmetria Psych: Normal affect   Assessment and Plan: Assessment and Plan                I discussed the assessment and treatment plan with the patient. The patient was provided an opportunity to ask questions and all were answered. The patient agreed with the plan and demonstrated an understanding of the instructions.   The patient was advised to call back or seek an in-person evaluation if the symptoms worsen or if the condition fails to improve as  anticipated.    Beverley KATHEE Hummer, MD  I,Emily Lagle,acting as a scribe for Beverley KATHEE Hummer, MD.,have documented all relevant documentation on the behalf of Beverley KATHEE Hummer, MD.  LILLETTE Beverley KATHEE Hummer, MD, have reviewed all documentation for this visit. The documentation on 01/24/2025 for the exam, diagnosis, procedures, and orders are all accurate and complete.

## 2025-01-27 ENCOUNTER — Ambulatory Visit: Admitting: Family Medicine
